# Patient Record
Sex: Female | Born: 1975 | Race: White | Hispanic: Yes | Marital: Married | Smoking: Never smoker
Health system: Southern US, Community
[De-identification: ages and names within clinical notes are randomized; demographics above are authoritative.]

## PROBLEM LIST (undated history)

## (undated) DIAGNOSIS — K8020 Calculus of gallbladder without cholecystitis without obstruction: Secondary | ICD-10-CM

## (undated) DIAGNOSIS — Z789 Other specified health status: Secondary | ICD-10-CM

## (undated) HISTORY — DX: Calculus of gallbladder without cholecystitis without obstruction: K80.20

---

## 2003-01-21 ENCOUNTER — Emergency Department (HOSPITAL_COMMUNITY): Admission: EM | Admit: 2003-01-21 | Discharge: 2003-01-21 | Payer: Self-pay | Admitting: Emergency Medicine

## 2003-07-30 ENCOUNTER — Ambulatory Visit (HOSPITAL_COMMUNITY): Admission: RE | Admit: 2003-07-30 | Discharge: 2003-07-30 | Payer: Self-pay | Admitting: *Deleted

## 2003-08-13 ENCOUNTER — Ambulatory Visit (HOSPITAL_COMMUNITY): Admission: RE | Admit: 2003-08-13 | Discharge: 2003-08-13 | Payer: Self-pay | Admitting: *Deleted

## 2004-01-03 ENCOUNTER — Inpatient Hospital Stay (HOSPITAL_COMMUNITY): Admission: AD | Admit: 2004-01-03 | Discharge: 2004-01-03 | Payer: Self-pay | Admitting: Obstetrics and Gynecology

## 2004-01-04 ENCOUNTER — Inpatient Hospital Stay (HOSPITAL_COMMUNITY): Admission: RE | Admit: 2004-01-04 | Discharge: 2004-01-07 | Payer: Self-pay | Admitting: Obstetrics & Gynecology

## 2004-01-04 ENCOUNTER — Inpatient Hospital Stay (HOSPITAL_COMMUNITY): Admission: AD | Admit: 2004-01-04 | Discharge: 2004-01-04 | Payer: Self-pay | Admitting: *Deleted

## 2009-08-12 ENCOUNTER — Ambulatory Visit: Payer: Self-pay | Admitting: Obstetrics and Gynecology

## 2009-08-14 ENCOUNTER — Ambulatory Visit (HOSPITAL_COMMUNITY): Admission: RE | Admit: 2009-08-14 | Discharge: 2009-08-14 | Payer: Self-pay | Admitting: Family Medicine

## 2010-07-09 NOTE — Op Note (Signed)
Katherine Fernandez, Katherine Fernandez          ACCOUNT NO.:  [id number]   MEDICAL RECORD NO.:  [id number]          PATIENT TYPE:  INP   LOCATION:  LDR4                          FACILITY:  [facility]   PHYSICIAN:  Tilda Burrow, M.D. DATE OF BIRTH:  12-23-1975   DATE OF PROCEDURE:  01/05/2004  DATE OF DISCHARGE:                                  PROCEDURE NOTE   Date of delivery is going to be January 05, 2004 at 1425. Onset of labor is  January 04, 2004 at 2300. Length of first stage labor 14 hours and 45  minutes. Length of second stage labor 40 minutes. Length of third stage  labor 17 minutes.   DELIVERY NOTE:  Katherine Fernandez had a normal spontaneous vaginal delivery of a viable  female infant with Apgars of 9 and 9. Upon delivery of infant, it was  thoroughly suctioned, cord clamped, dried, and passed off to the nursery  nurse. Had good tone, good strong cry, and pinked up well. Apgars were 9 and  9. Third stage of labor was actively managed with 20 units of Pitocin in  1,000 cc of LR to rapid rate. Upon inspection, second degree perineal  laceration was noted which was infiltrated with 25 cc of 1% lidocaine for  local anesthetic and repaired with 4 interrupted sutures of 2-0 Vicryl and  the skin closed with subcuticular stitch with 2-0 Vicryl. Placenta was  delivered spontaneously via Schultze's mechanism. Three-vessel cord was  noted upon inspection. Good hemostasis was obtained. Estimated blood loss  was approximately 350 cc. Infant and mother were stabilized and transferred  out to the post partum unit.     Darl   DL/MEDQ  D:  16/11/9602  T:  01/05/2004  Job:  [id number]

## 2010-07-09 NOTE — H&P (Signed)
NAMELUDDIE, Katherine Fernandez          ACCOUNT NO.:  [id number]   MEDICAL RECORD NO.:  [id number]          PATIENT TYPE:  INP   LOCATION:  LDR4                          FACILITY:  [facility]   PHYSICIAN:  Tilda Burrow, M.D. DATE OF BIRTH:  [birth year]   DATE OF ADMISSION:  01/04/2004  DATE OF DISCHARGE:  LH                                HISTORY & PHYSICAL   This is an obstetrical patient who has decided to transfer from [facility] here to have her baby.  She is [redacted] weeks gestation.  She presented  last night in early labor.  She is a gravida 1 para 0.  She is now 3-4 cm,  completely effaced, zero station.   PAST MEDICAL HISTORY:  Negative.   FAMILY HISTORY:  Positive for hypertension and thrombophlebitis in her  mother.   PAST SURGICAL HISTORY:  Negative.  GBS is positive.   HISTORY OF PRESENT ILLNESS:  Prenatal course was uneventful.  Blood type is  B positive.  Antibody screen is negative.  Syphilis serology is negative.  Hepatitis B surface antigen is negative.  Rubella is immune.  GC and  chlamydia were both negative.  Pap smear was normal.  AFP was within normal  range.  Group B strep is positive.  We will treat her with ampicillin 2 g, 1  g q.4.   PLAN:  We are going to admit and start some pitocin augmentation and  continue our GBS prophylaxis.     Darl   DL/MEDQ  D:  16/11/9602  T:  01/05/2004  Job:  [id number]   cc:   [facility]

## 2012-01-10 LAB — OB RESULTS CONSOLE RPR: RPR: NONREACTIVE

## 2012-07-26 ENCOUNTER — Other Ambulatory Visit: Payer: Self-pay | Admitting: Obstetrics

## 2012-08-04 NOTE — H&P (Signed)
NAMEJOHNA, KEARL          ACCOUNT NO.:  [id number]  MEDICAL RECORD NO.:  [id number]  LOCATION:  PERIO                         FACILITY:  [facility]  PHYSICIAN:  Kathreen Cosier, M.D.DATE OF BIRTH:  Nov 21, 1975  DATE OF ADMISSION:  07/26/2012 DATE OF DISCHARGE:                             HISTORY & PHYSICAL   HISTORY OF PRESENT ILLNESS:  The patient is a 37 year old, gravida 4, para 2-1-0-3, who had a C-section in the past.  She is due on August 15, 2012 and desires repeat C-section and tubal ligation.  During this pregnancy, she has had a positive GBS.  Otherwise, she has had uneventful antenatal course.  PAST MEDICAL HISTORY:  Negative.  PAST SURGICAL HISTORY:  C-section x1.  SOCIAL HISTORY:  Negative.  SYSTEM REVIEW:  Noncontributory.  PHYSICAL EXAMINATION:  GENERAL:  Well-developed female in no distress. HEENT:  Negative. LUNGS:  Clear to P and A. HEART:  Regular rhythm.  No murmurs, no gallops. BREASTS:  Negative. ABDOMEN:  Term.  Pelvic, cervix closed. EXTREMITIES:  Negative.          ______________________________ Kathreen Cosier, M.D.     BAM/MEDQ  D:  08/03/2012  T:  08/04/2012  Job:  [id number]

## 2012-08-07 ENCOUNTER — Encounter (HOSPITAL_COMMUNITY)
Admission: RE | Admit: 2012-08-07 | Discharge: 2012-08-07 | Disposition: A | Discharge status: Home / Self Care | Payer: Medicaid Other | Source: Ambulatory Visit | Attending: Obstetrics | Admitting: Obstetrics

## 2012-08-07 ENCOUNTER — Encounter (HOSPITAL_COMMUNITY): Payer: Self-pay

## 2012-08-07 HISTORY — DX: Other specified health status: Z78.9

## 2012-08-07 LAB — CBC
HCT: 33.2 % — ABNORMAL LOW (ref 36.0–46.0)
Hemoglobin: 11.2 g/dL — ABNORMAL LOW (ref 12.0–15.0)
MCV: 83.2 fL (ref 78.0–100.0)
Platelets: 206 10*3/uL (ref 150–400)
RBC: 3.99 MIL/uL (ref 3.87–5.11)
WBC: 7.9 10*3/uL (ref 4.0–10.5)

## 2012-08-07 LAB — TYPE AND SCREEN: Antibody Screen: NEGATIVE

## 2012-08-07 LAB — ABO/RH: ABO/RH(D): B POS

## 2012-08-07 NOTE — Patient Instructions (Addendum)
20 [name]  08/07/2012   Your procedure is scheduled on:  08/09/12  Enter through the Main Entrance of [facility] at 6 AM.  Pick up the phone at the desk and dial [phone].   Call this number if you have problems the morning of surgery: [phone]   Remember:   Do not eat food:After Midnight.  Do not drink clear liquids: After Midnight.  Take these medicines the morning of surgery with A SIP OF WATER: NA   Do not wear jewelry, make-up or nail polish.  Do not wear lotions, powders, or perfumes. You may wear deodorant.  Do not shave 48 hours prior to surgery.  Do not bring valuables to the hospital.  [facility] is not responsible                  for any belongings or valuables brought to the hospital.  Contacts, dentures or bridgework may not be worn into surgery.  Leave suitcase in the car. After surgery it may be brought to your room.  For patients admitted to the hospital, checkout time is 11:00 AM the day of                discharge.   Patients discharged the day of surgery will not be allowed to drive                   home.  Name and phone number of your driver: NA  Special Instructions: Shower using CHG 2 nights before surgery and the night before surgery.  If you shower the day of surgery use CHG.  Use special wash - you have one bottle of CHG for all showers.  You should use approximately 1/3 of the bottle for each shower.   Please read over the following fact sheets that you were given: Surgical Site Infection Prevention

## 2012-08-09 ENCOUNTER — Encounter (HOSPITAL_COMMUNITY): Payer: Self-pay | Admitting: *Deleted

## 2012-08-09 ENCOUNTER — Encounter (HOSPITAL_COMMUNITY): Payer: Self-pay | Admitting: Anesthesiology

## 2012-08-09 ENCOUNTER — Encounter (HOSPITAL_COMMUNITY)
Admission: RE | Disposition: A | Discharge status: Home / Self Care | Payer: Self-pay | Source: Ambulatory Visit | Attending: Obstetrics

## 2012-08-09 ENCOUNTER — Inpatient Hospital Stay (HOSPITAL_COMMUNITY): Payer: Medicaid Other | Admitting: Anesthesiology

## 2012-08-09 ENCOUNTER — Inpatient Hospital Stay (HOSPITAL_COMMUNITY)
Admission: RE | Admit: 2012-08-09 | Discharge: 2012-08-11 | DRG: 766 | Disposition: A | Discharge status: Home / Self Care | Payer: Medicaid Other | Source: Ambulatory Visit | Attending: Obstetrics | Admitting: Obstetrics

## 2012-08-09 DIAGNOSIS — Z22330 Carrier of Group B streptococcus: Secondary | ICD-10-CM

## 2012-08-09 DIAGNOSIS — Z98891 History of uterine scar from previous surgery: Secondary | ICD-10-CM

## 2012-08-09 DIAGNOSIS — Z302 Encounter for sterilization: Secondary | ICD-10-CM

## 2012-08-09 DIAGNOSIS — O99892 Other specified diseases and conditions complicating childbirth: Secondary | ICD-10-CM | POA: Diagnosis present

## 2012-08-09 DIAGNOSIS — O34219 Maternal care for unspecified type scar from previous cesarean delivery: Principal | ICD-10-CM | POA: Diagnosis present

## 2012-08-09 DIAGNOSIS — O09529 Supervision of elderly multigravida, unspecified trimester: Secondary | ICD-10-CM | POA: Diagnosis present

## 2012-08-09 HISTORY — PX: TUBAL LIGATION: SHX77

## 2012-08-09 LAB — OB RESULTS CONSOLE ABO/RH: RH Type: POSITIVE

## 2012-08-09 SURGERY — Surgical Case
Anesthesia: Spinal

## 2012-08-09 MED ORDER — KETOROLAC TROMETHAMINE 30 MG/ML IJ SOLN: 30.0000 mg | Freq: Four times a day (QID) | INTRAMUSCULAR | Status: AC | PRN

## 2012-08-09 MED ORDER — ONDANSETRON HCL 4 MG PO TABS: 4.0000 mg | ORAL_TABLET | ORAL | Status: DC | PRN

## 2012-08-09 MED ORDER — LANOLIN HYDROUS EX OINT: 1.0000 "application " | TOPICAL_OINTMENT | CUTANEOUS | Status: DC | PRN

## 2012-08-09 MED ORDER — TETANUS-DIPHTH-ACELL PERTUSSIS 5-2.5-18.5 LF-MCG/0.5 IM SUSP: 0.5000 mL | Freq: Once | INTRAMUSCULAR | Status: DC

## 2012-08-09 MED ORDER — MEPERIDINE HCL 25 MG/ML IJ SOLN: 6.2500 mg | INTRAMUSCULAR | Status: DC | PRN

## 2012-08-09 MED ORDER — BUPIVACAINE HCL (PF) 0.25 % IJ SOLN
INTRAMUSCULAR | Status: AC
  Filled 2012-08-09: qty 30

## 2012-08-09 MED ORDER — MORPHINE SULFATE 0.5 MG/ML IJ SOLN
INTRAMUSCULAR | Status: AC
  Filled 2012-08-09: qty 10

## 2012-08-09 MED ORDER — DIPHENHYDRAMINE HCL 50 MG/ML IJ SOLN: 25.0000 mg | INTRAMUSCULAR | Status: DC | PRN

## 2012-08-09 MED ORDER — FENTANYL CITRATE 0.05 MG/ML IJ SOLN
INTRAMUSCULAR | Status: AC
  Filled 2012-08-09: qty 2

## 2012-08-09 MED ORDER — OXYCODONE-ACETAMINOPHEN 5-325 MG PO TABS
1.0000 | ORAL_TABLET | ORAL | Status: DC | PRN
  Administered 2012-08-11 (×2): 1 via ORAL
  Filled 2012-08-09 (×2): qty 1

## 2012-08-09 MED ORDER — SCOPOLAMINE 1 MG/3DAYS TD PT72: 1.0000 | MEDICATED_PATCH | Freq: Once | TRANSDERMAL | Status: DC

## 2012-08-09 MED ORDER — ONDANSETRON HCL 4 MG/2ML IJ SOLN
INTRAMUSCULAR | Status: DC | PRN
  Administered 2012-08-09: 4 mg via INTRAVENOUS

## 2012-08-09 MED ORDER — OXYTOCIN 10 UNIT/ML IJ SOLN
40.0000 [IU] | INTRAVENOUS | Status: DC | PRN
  Administered 2012-08-09: 40 [IU] via INTRAVENOUS

## 2012-08-09 MED ORDER — ZOLPIDEM TARTRATE 5 MG PO TABS: 5.0000 mg | ORAL_TABLET | Freq: Every evening | ORAL | Status: DC | PRN

## 2012-08-09 MED ORDER — OXYTOCIN 40 UNITS IN LACTATED RINGERS INFUSION - SIMPLE MED: 62.5000 mL/h | INTRAVENOUS | Status: AC

## 2012-08-09 MED ORDER — FENTANYL CITRATE 0.05 MG/ML IJ SOLN
INTRAMUSCULAR | Status: DC | PRN
  Administered 2012-08-09: 25 ug via INTRATHECAL

## 2012-08-09 MED ORDER — LACTATED RINGERS IV SOLN
INTRAVENOUS | Status: DC
  Administered 2012-08-09: 18:00:00 via INTRAVENOUS

## 2012-08-09 MED ORDER — FENTANYL CITRATE 0.05 MG/ML IJ SOLN: 25.0000 ug | INTRAMUSCULAR | Status: DC | PRN

## 2012-08-09 MED ORDER — LACTATED RINGERS IV SOLN
INTRAVENOUS | Status: DC | PRN
  Administered 2012-08-09: 08:00:00 via INTRAVENOUS

## 2012-08-09 MED ORDER — 0.9 % SODIUM CHLORIDE (POUR BTL) OPTIME
TOPICAL | Status: DC | PRN
  Administered 2012-08-09: 1000 mL

## 2012-08-09 MED ORDER — DIPHENHYDRAMINE HCL 25 MG PO CAPS: 25.0000 mg | ORAL_CAPSULE | Freq: Four times a day (QID) | ORAL | Status: DC | PRN

## 2012-08-09 MED ORDER — SIMETHICONE 80 MG PO CHEW: 80.0000 mg | CHEWABLE_TABLET | ORAL | Status: DC | PRN

## 2012-08-09 MED ORDER — ONDANSETRON HCL 4 MG/2ML IJ SOLN: 4.0000 mg | Freq: Three times a day (TID) | INTRAMUSCULAR | Status: DC | PRN

## 2012-08-09 MED ORDER — NALBUPHINE SYRINGE 5 MG/0.5 ML
5.0000 mg | INJECTION | INTRAMUSCULAR | Status: DC | PRN
  Filled 2012-08-09: qty 1

## 2012-08-09 MED ORDER — DIBUCAINE 1 % RE OINT: 1.0000 "application " | TOPICAL_OINTMENT | RECTAL | Status: DC | PRN

## 2012-08-09 MED ORDER — DIPHENHYDRAMINE HCL 25 MG PO CAPS: 25.0000 mg | ORAL_CAPSULE | ORAL | Status: DC | PRN

## 2012-08-09 MED ORDER — LACTATED RINGERS IV SOLN
INTRAVENOUS | Status: DC | PRN
  Administered 2012-08-09 (×4): via INTRAVENOUS

## 2012-08-09 MED ORDER — PRENATAL MULTIVITAMIN CH
1.0000 | ORAL_TABLET | Freq: Every day | ORAL | Status: DC
  Administered 2012-08-10 – 2012-08-11 (×2): 1 via ORAL
  Filled 2012-08-09 (×2): qty 1

## 2012-08-09 MED ORDER — SCOPOLAMINE 1 MG/3DAYS TD PT72
MEDICATED_PATCH | TRANSDERMAL | Status: AC
  Administered 2012-08-09: 1.5 mg via TRANSDERMAL
  Filled 2012-08-09: qty 1

## 2012-08-09 MED ORDER — SIMETHICONE 80 MG PO CHEW
80.0000 mg | CHEWABLE_TABLET | Freq: Three times a day (TID) | ORAL | Status: DC
  Administered 2012-08-10 – 2012-08-11 (×4): 80 mg via ORAL

## 2012-08-09 MED ORDER — WITCH HAZEL-GLYCERIN EX PADS: 1.0000 "application " | MEDICATED_PAD | CUTANEOUS | Status: DC | PRN

## 2012-08-09 MED ORDER — SENNOSIDES-DOCUSATE SODIUM 8.6-50 MG PO TABS
2.0000 | ORAL_TABLET | Freq: Every day | ORAL | Status: DC
  Administered 2012-08-10: 2 via ORAL

## 2012-08-09 MED ORDER — PHENYLEPHRINE HCL 10 MG/ML IJ SOLN
INTRAMUSCULAR | Status: DC | PRN
  Administered 2012-08-09 (×3): 80 ug via INTRAVENOUS
  Administered 2012-08-09 (×2): 40 ug via INTRAVENOUS
  Administered 2012-08-09: 80 ug via INTRAVENOUS

## 2012-08-09 MED ORDER — IBUPROFEN 600 MG PO TABS
600.0000 mg | ORAL_TABLET | Freq: Four times a day (QID) | ORAL | Status: DC
  Administered 2012-08-10 – 2012-08-11 (×6): 600 mg via ORAL
  Filled 2012-08-09 (×6): qty 1

## 2012-08-09 MED ORDER — MORPHINE SULFATE (PF) 0.5 MG/ML IJ SOLN
INTRAMUSCULAR | Status: DC | PRN
  Administered 2012-08-09: .15 mg via INTRATHECAL

## 2012-08-09 MED ORDER — NALBUPHINE SYRINGE 5 MG/0.5 ML
INJECTION | INTRAMUSCULAR | Status: AC
  Administered 2012-08-09: 10 mg via SUBCUTANEOUS
  Filled 2012-08-09: qty 1

## 2012-08-09 MED ORDER — SODIUM CHLORIDE 0.9 % IJ SOLN: 3.0000 mL | INTRAMUSCULAR | Status: DC | PRN

## 2012-08-09 MED ORDER — NALOXONE HCL 1 MG/ML IJ SOLN
1.0000 ug/kg/h | INTRAVENOUS | Status: DC | PRN
  Filled 2012-08-09: qty 2

## 2012-08-09 MED ORDER — KETOROLAC TROMETHAMINE 60 MG/2ML IM SOLN
INTRAMUSCULAR | Status: AC
  Administered 2012-08-09: 60 mg via INTRAMUSCULAR
  Filled 2012-08-09: qty 2

## 2012-08-09 MED ORDER — DIPHENHYDRAMINE HCL 50 MG/ML IJ SOLN: 12.5000 mg | INTRAMUSCULAR | Status: DC | PRN

## 2012-08-09 MED ORDER — KETOROLAC TROMETHAMINE 60 MG/2ML IM SOLN: 60.0000 mg | Freq: Once | INTRAMUSCULAR | Status: AC | PRN

## 2012-08-09 MED ORDER — LACTATED RINGERS IV SOLN
INTRAVENOUS | Status: DC
  Administered 2012-08-09: 07:00:00 via INTRAVENOUS

## 2012-08-09 MED ORDER — PHENYLEPHRINE 40 MCG/ML (10ML) SYRINGE FOR IV PUSH (FOR BLOOD PRESSURE SUPPORT)
PREFILLED_SYRINGE | INTRAVENOUS | Status: AC
  Filled 2012-08-09: qty 10

## 2012-08-09 MED ORDER — ONDANSETRON HCL 4 MG/2ML IJ SOLN: 4.0000 mg | INTRAMUSCULAR | Status: DC | PRN

## 2012-08-09 MED ORDER — METOCLOPRAMIDE HCL 5 MG/ML IJ SOLN: 10.0000 mg | Freq: Three times a day (TID) | INTRAMUSCULAR | Status: DC | PRN

## 2012-08-09 MED ORDER — BUPIVACAINE IN DEXTROSE 0.75-8.25 % IT SOLN
INTRATHECAL | Status: DC | PRN
  Administered 2012-08-09: 1.4 mL via INTRATHECAL

## 2012-08-09 MED ORDER — MENTHOL 3 MG MT LOZG: 1.0000 | LOZENGE | OROMUCOSAL | Status: DC | PRN

## 2012-08-09 MED ORDER — CEFAZOLIN SODIUM-DEXTROSE 2-3 GM-% IV SOLR
INTRAVENOUS | Status: AC
  Administered 2012-08-09: 2 g via INTRAVENOUS
  Filled 2012-08-09: qty 50

## 2012-08-09 MED ORDER — NALOXONE HCL 0.4 MG/ML IJ SOLN: 0.4000 mg | INTRAMUSCULAR | Status: DC | PRN

## 2012-08-09 SURGICAL SUPPLY — 32 items
ADH SKN CLS APL DERMABOND .7 (GAUZE/BANDAGES/DRESSINGS) ×2
CLAMP CORD UMBIL (MISCELLANEOUS) IMPLANT
CLOTH BEACON ORANGE TIMEOUT ST (SAFETY) ×3 IMPLANT
DERMABOND ADVANCED (GAUZE/BANDAGES/DRESSINGS) ×1
DERMABOND ADVANCED .7 DNX12 (GAUZE/BANDAGES/DRESSINGS) ×2 IMPLANT
DRAPE LG THREE QUARTER DISP (DRAPES) ×3 IMPLANT
DRSG OPSITE POSTOP 4X10 (GAUZE/BANDAGES/DRESSINGS) ×3 IMPLANT
DURAPREP 26ML APPLICATOR (WOUND CARE) ×3 IMPLANT
ELECT REM PT RETURN 9FT ADLT (ELECTROSURGICAL) ×3
ELECTRODE REM PT RTRN 9FT ADLT (ELECTROSURGICAL) ×2 IMPLANT
EXTRACTOR VACUUM M CUP 4 TUBE (SUCTIONS) IMPLANT
GLOVE BIO SURGEON STRL SZ8.5 (GLOVE) ×3 IMPLANT
GOWN PREVENTION PLUS XXLARGE (GOWN DISPOSABLE) ×3 IMPLANT
GOWN STRL REIN XL XLG (GOWN DISPOSABLE) ×6 IMPLANT
KIT ABG SYR 3ML LUER SLIP (SYRINGE) IMPLANT
NDL HYPO 25X5/8 SAFETYGLIDE (NEEDLE) ×2 IMPLANT
NEEDLE HYPO 25X5/8 SAFETYGLIDE (NEEDLE) ×3 IMPLANT
NS IRRIG 1000ML POUR BTL (IV SOLUTION) ×3 IMPLANT
PACK C SECTION WH (CUSTOM PROCEDURE TRAY) ×3 IMPLANT
PAD OB MATERNITY 4.3X12.25 (PERSONAL CARE ITEMS) ×3 IMPLANT
SUT CHROMIC 0 CT 802H (SUTURE) ×3 IMPLANT
SUT CHROMIC 1 CTX 36 (SUTURE) ×6 IMPLANT
SUT CHROMIC 2 0 SH (SUTURE) ×3 IMPLANT
SUT GUT PLAIN 0 CT-3 TAN 27 (SUTURE) IMPLANT
SUT MON AB 4-0 PS1 27 (SUTURE) ×3 IMPLANT
SUT VIC AB 0 CT1 18XCR BRD8 (SUTURE) IMPLANT
SUT VIC AB 0 CT1 8-18 (SUTURE)
SUT VIC AB 0 CTX 36 (SUTURE) ×6
SUT VIC AB 0 CTX36XBRD ANBCTRL (SUTURE) ×4 IMPLANT
TOWEL OR 17X24 6PK STRL BLUE (TOWEL DISPOSABLE) ×9 IMPLANT
TRAY FOLEY CATH 14FR (SET/KITS/TRAYS/PACK) ×3 IMPLANT
WATER STERILE IRR 1000ML POUR (IV SOLUTION) ×3 IMPLANT

## 2012-08-09 NOTE — Anesthesia Postprocedure Evaluation (Signed)
  Anesthesia Post-op Note  Patient: Katherine Fernandez  Procedure(s) Performed: Procedure(s): REPEAT CESAREAN SECTION (N/A) BILATERAL TUBAL LIGATION (Bilateral)  Patient Location: Mother/Baby  Anesthesia Type:Spinal  Level of Consciousness: awake  Airway and Oxygen Therapy: Patient Spontanous Breathing  Post-op Pain: mild  Post-op Assessment: Patient's Cardiovascular Status Stable and Respiratory Function Stable  Post-op Vital Signs: stable  Complications: No apparent anesthesia complications

## 2012-08-09 NOTE — Anesthesia Procedure Notes (Signed)
Spinal  Patient location during procedure: OR Start time: 08/09/2012 7:39 AM Staffing Anesthesiologist: Jourdan Maldonado A. Performed by: anesthesiologist  Preanesthetic Checklist Completed: patient identified, site marked, surgical consent, pre-op evaluation, timeout performed, IV checked, risks and benefits discussed and monitors and equipment checked Spinal Block Patient position: sitting Prep: site prepped and draped and DuraPrep Patient monitoring: heart rate, cardiac monitor, continuous pulse ox and blood pressure Approach: midline Location: L3-4 Injection technique: single-shot Needle Needle type: Sprotte  Needle gauge: 24 G Needle length: 9 cm Assessment Sensory level: T4 Additional Notes Patient tolerated procedure well. Adequate sensory level.

## 2012-08-09 NOTE — Transfer of Care (Signed)
Immediate Anesthesia Transfer of Care Note  Patient: Katherine Fernandez  Procedure(s) Performed: Procedure(s): REPEAT CESAREAN SECTION (N/A)  Patient Location: PACU  Anesthesia Type:Spinal  Level of Consciousness: awake, alert  and oriented  Airway & Oxygen Therapy: Patient Spontanous Breathing  Post-op Assessment: Report given to PACU RN and Post -op Vital signs reviewed and stable  Post vital signs: Reviewed and stable  Complications: No apparent anesthesia complications

## 2012-08-09 NOTE — Lactation Note (Signed)
This note was copied from the chart of Katherine Fernandez. Lactation Consultation Note  Patient Name: Katherine Fernandez [id number] Date: 08/09/2012 Reason for consult: Initial assessment Mother in PACU and in to assist. Baby was latched and had been assisted by the Adm. RN. Mother is experienced with breastfeeding and she is able to self attach her baby. Assisted was needed due to mother's position in the bed. Mother doesn't speak [place] and interpreter present. Basic teaching done including the benefits of exclusive breastfeeding, omitting pacifiers, rooming in and feeding with cues. Mother asked if she "has enough milk now for her baby" and teaching done. Mother was shown hand expression and she has lots of colostrum. Lactation Handout given with explanation of services and support. Nursing and LC to assist mother with feedings as needed.  Maternal Data Formula Feeding for Exclusion: Yes Reason for exclusion: Mother's choice to formula and breast feed on admission Infant to breast within first hour of birth: Yes Has patient been taught Hand Expression?: Yes (demonstrated, will need additional teaching) Does the patient have breastfeeding experience prior to this delivery?: Yes  Feeding Feeding Type: Breast Milk Feeding method: Breast Length of feed: 15 min  LATCH Score/Interventions Latch: Grasps breast easily, tongue down, lips flanged, rhythmical sucking.  Audible Swallowing: Spontaneous and intermittent Intervention(s): Skin to skin  Type of Nipple: Everted at rest and after stimulation  Comfort (Breast/Nipple): Soft / non-tender     Hold (Positioning): Assistance needed to correctly position infant at breast and maintain latch. (assitance due to position in bed, PACU) Intervention(s): Breastfeeding basics reviewed;Skin to skin (teaching with a spanish interpreter)  LATCH Score: 9  Lactation Tools Discussed/Used     Consult Status      Omar Person 08/09/2012, 9:43 AM

## 2012-08-09 NOTE — Anesthesia Postprocedure Evaluation (Signed)
Anesthesia Post Note  Patient: Katherine Fernandez  Procedure(s) Performed: Procedure(s) (LRB): REPEAT CESAREAN SECTION (N/A) BILATERAL TUBAL LIGATION (Bilateral)  Anesthesia type: Spinal  Patient location: PACU  Post pain: Pain level controlled  Post assessment: Post-op Vital signs reviewed  Last Vitals:  Filed Vitals:   08/09/12 0617  BP: 115/75  Pulse: 98  Temp: 36.7 C  Resp: 18    Post vital signs: Reviewed  Level of consciousness: awake  Complications: No apparent anesthesia complications

## 2012-08-09 NOTE — Anesthesia Preprocedure Evaluation (Signed)
Anesthesia Evaluation  Patient identified by MRN, date of birth, ID band Patient awake    Reviewed: Allergy & Precautions, H&P , NPO status , Patient's Chart, lab work & pertinent test results, reviewed documented beta blocker date and time   History of Anesthesia Complications Negative for: history of anesthetic complications  Airway Mallampati: II TM Distance: >3 FB Neck ROM: full    Dental  (+) Teeth Intact   Pulmonary neg pulmonary ROS,  breath sounds clear to auscultation        Cardiovascular negative cardio ROS  Rhythm:regular Rate:Normal     Neuro/Psych negative neurological ROS  negative psych ROS   GI/Hepatic negative GI ROS, Neg liver ROS,   Endo/Other  negative endocrine ROS  Renal/GU negative Renal ROS  negative genitourinary   Musculoskeletal   Abdominal   Peds  Hematology negative hematology ROS (+)   Anesthesia Other Findings   Reproductive/Obstetrics (+) Pregnancy (h/o c/s x1, for repeat and BTL)                           Anesthesia Physical Anesthesia Plan  ASA: II  Anesthesia Plan: Spinal   Post-op Pain Management:    Induction:   Airway Management Planned:   Additional Equipment:   Intra-op Plan:   Post-operative Plan:   Informed Consent: I have reviewed the patients History and Physical, chart, labs and discussed the procedure including the risks, benefits and alternatives for the proposed anesthesia with the patient or authorized representative who has indicated his/her understanding and acceptance.     Plan Discussed with: Surgeon and CRNA  Anesthesia Plan Comments:         Anesthesia Quick Evaluation

## 2012-08-09 NOTE — Op Note (Signed)
preop diagnosis previous C-section at term multiparity postop diagnosis The same repeat low transverse cesarean section and tubal ligation Surgeon Dr. Francoise Ceo First assistant Dr. Coral Ceo Anesthesia spinal Procedure patient placed on the operating table in the supine position abdomen prepped and draped bladder emptied with a Foley catheter transverse suprapubic incision made through the old scar carried him to the rectus fascia fascia cleaned and incised the length of the incision recti muscles retracted laterally peritoneum incised longitudinally transverse incision made on the visceroperitoneum above the bladder bladder mobilized inferiorly transverse low uterine incision made the fluids clear patient delivered from the OP position of a female Apgar 8 and and a him in attendance placenta anterior removed manually and sent to labor and delivery uterine cavity clean with dry laps uterine incision closed in one layer with continuous  l one chromic hemostasis satisfactory the right tube  grasped in the midportion with a Babcock clamp 0 plain suture placed in the mesial salpinx below the portion of tube within the clamp was tied and and 1 inch of tube transected procedure done in a similar fashion on the other side lap and sponge counts correct abdomen closed in layers peritoneum continuous within of 0 chromic fascia continuous within of 0 Dexon and the skin closed with subcuticular stitch of 4-0 Monocryl blood loss was 600 cc patient tolerated the procedure well

## 2012-08-09 NOTE — H&P (Signed)
There has been no change in her history and physical since her original dictation  

## 2012-08-09 NOTE — Progress Notes (Signed)
UR completed 

## 2012-08-10 ENCOUNTER — Encounter (HOSPITAL_COMMUNITY): Payer: Self-pay | Admitting: Obstetrics

## 2012-08-10 LAB — CBC
Hemoglobin: 10.1 g/dL — ABNORMAL LOW (ref 12.0–15.0)
MCH: 27.7 pg (ref 26.0–34.0)
Platelets: 184 10*3/uL (ref 150–400)
RBC: 3.64 MIL/uL — ABNORMAL LOW (ref 3.87–5.11)
WBC: 9.8 10*3/uL (ref 4.0–10.5)

## 2012-08-10 NOTE — Progress Notes (Signed)
Patient ID: Katherine Fernandez, female   DOB: [birth year], 38 y.o.   MRN: [id number] Postop day 1 Vital signs normal Abdomen soft Legs negative doing well and

## 2012-08-11 MED ORDER — IBUPROFEN 600 MG PO TABS: 600.0000 mg | ORAL_TABLET | Freq: Four times a day (QID) | ORAL | Status: DC | PRN

## 2012-08-11 MED ORDER — OXYCODONE-ACETAMINOPHEN 5-325 MG PO TABS: 1.0000 | ORAL_TABLET | ORAL | Status: DC | PRN

## 2012-08-11 NOTE — Progress Notes (Signed)
Subjective: Postpartum Day 2: Cesarean Delivery Patient reports tolerating PO, + flatus and no problems voiding.    Objective: Vital signs in last 24 hours: Temp:  [98.4 F (36.9 C)-98.6 F (37 C)] 98.4 F (36.9 C) (06/21 0547) Pulse Rate:  [72-78] 78 (06/21 0547) Resp:  [18-20] 18 (06/21 0547) BP: (108-112)/(60-72) 112/60 mmHg (06/21 0547) SpO2:  [94 %-99 %] 94 % (06/21 0547)  Physical Exam:  General: alert and no distress Lochia: appropriate Uterine Fundus: firm Incision: healing well DVT Evaluation: No evidence of DVT seen on physical exam.   Recent Labs  08/10/12 0545  HGB 10.1*  HCT 30.0*    Assessment/Plan: Status post Cesarean section. Doing well postoperatively.  Discharge home with standard precautions and return to clinic in 4-6 weeks.  Ashar Lewinski A 08/11/2012, 7:46 AM

## 2012-08-11 NOTE — Discharge Summary (Signed)
Obstetric Discharge Summary Reason for Admission: cesarean section and BTL Prenatal Procedures: ultrasound Intrapartum Procedures: cesarean: low cervical, transverse and tubal ligation Postpartum Procedures: none Complications-Operative and Postpartum: none Hemoglobin  Date Value Range Status  08/10/2012 10.1* 12.0 - 15.0 g/dL Final     HCT  Date Value Range Status  08/10/2012 30.0* 36.0 - 46.0 % Final    Physical Exam:  General: alert and no distress Lochia: appropriate Uterine Fundus: firm Incision: healing well DVT Evaluation: No evidence of DVT seen on physical exam.  Discharge Diagnoses: Term Pregnancy-delivered  Discharge Information: Date: 08/11/2012 Activity: pelvic rest Diet: routine Medications: PNV, Ibuprofen, Colace and Percocet Condition: stable Instructions: refer to practice specific booklet Discharge to: home Follow-up Information   Follow up with MARSHALL,BERNARD A, MD. Schedule an appointment as soon as possible for a visit in 6 weeks.   Contact information:   [address] [place] [address] [phone]       Newborn Data: Live born female  Birth Weight: 6 lb 11.8 oz (3055 g) APGAR: 8, 9  Home with mother.  HARPER,CHARLES A 08/11/2012, 7:52 AM

## 2013-12-23 ENCOUNTER — Encounter (HOSPITAL_COMMUNITY): Payer: Self-pay | Admitting: Obstetrics

## 2021-04-03 ENCOUNTER — Emergency Department (HOSPITAL_COMMUNITY)
Admission: EM | Admit: 2021-04-03 | Discharge: 2021-04-03 | Disposition: A | Discharge status: Home / Self Care | Payer: Self-pay | Attending: Emergency Medicine | Admitting: Emergency Medicine

## 2021-04-03 ENCOUNTER — Other Ambulatory Visit: Payer: Self-pay

## 2021-04-03 ENCOUNTER — Emergency Department (HOSPITAL_COMMUNITY): Payer: Self-pay

## 2021-04-03 ENCOUNTER — Encounter (HOSPITAL_COMMUNITY): Payer: Self-pay | Admitting: Emergency Medicine

## 2021-04-03 DIAGNOSIS — K8020 Calculus of gallbladder without cholecystitis without obstruction: Secondary | ICD-10-CM | POA: Insufficient documentation

## 2021-04-03 DIAGNOSIS — R52 Pain, unspecified: Secondary | ICD-10-CM

## 2021-04-03 DIAGNOSIS — R1011 Right upper quadrant pain: Secondary | ICD-10-CM

## 2021-04-03 DIAGNOSIS — R202 Paresthesia of skin: Secondary | ICD-10-CM | POA: Insufficient documentation

## 2021-04-03 DIAGNOSIS — R0789 Other chest pain: Secondary | ICD-10-CM | POA: Insufficient documentation

## 2021-04-03 DIAGNOSIS — R002 Palpitations: Secondary | ICD-10-CM | POA: Insufficient documentation

## 2021-04-03 DIAGNOSIS — Z20822 Contact with and (suspected) exposure to covid-19: Secondary | ICD-10-CM | POA: Insufficient documentation

## 2021-04-03 DIAGNOSIS — M25512 Pain in left shoulder: Secondary | ICD-10-CM | POA: Insufficient documentation

## 2021-04-03 DIAGNOSIS — R0602 Shortness of breath: Secondary | ICD-10-CM | POA: Insufficient documentation

## 2021-04-03 LAB — COMPREHENSIVE METABOLIC PANEL
ALT: 19 U/L (ref 0–44)
AST: 19 U/L (ref 15–41)
Albumin: 4.1 g/dL (ref 3.5–5.0)
Alkaline Phosphatase: 72 U/L (ref 38–126)
Anion gap: 10 (ref 5–15)
BUN: 7 mg/dL (ref 6–20)
CO2: 26 mmol/L (ref 22–32)
Calcium: 9.4 mg/dL (ref 8.9–10.3)
Chloride: 104 mmol/L (ref 98–111)
Creatinine, Ser: 0.78 mg/dL (ref 0.44–1.00)
GFR, Estimated: >60 mL/min (ref >60)
Glucose, Bld: 92 mg/dL (ref 70–99)
Potassium: 3.4 mmol/L — ABNORMAL LOW (ref 3.5–5.1)
Sodium: 140 mmol/L (ref 135–145)
Total Bilirubin: 0.8 mg/dL (ref 0.3–1.2)
Total Protein: 6.9 g/dL (ref 6.5–8.1)

## 2021-04-03 LAB — RESP PANEL BY RT-PCR (FLU A&B, COVID) ARPGX2
Influenza A by PCR: NEGATIVE
Influenza B by PCR: NEGATIVE
SARS Coronavirus 2 by RT PCR: NEGATIVE

## 2021-04-03 LAB — URINALYSIS, ROUTINE W REFLEX MICROSCOPIC
Bilirubin Urine: NEGATIVE
Glucose, UA: NEGATIVE mg/dL
Ketones, ur: NEGATIVE mg/dL
Leukocytes,Ua: NEGATIVE
Nitrite: NEGATIVE
Protein, ur: NEGATIVE mg/dL
Specific Gravity, Urine: 1.015 (ref 1.005–1.030)
pH: 6 (ref 5.0–8.0)

## 2021-04-03 LAB — CBC
HCT: 40.9 % (ref 36.0–46.0)
Hemoglobin: 13.9 g/dL (ref 12.0–15.0)
MCH: 29.3 pg (ref 26.0–34.0)
MCHC: 34 g/dL (ref 30.0–36.0)
MCV: 86.1 fL (ref 80.0–100.0)
Platelets: 257 10*3/uL (ref 150–400)
RBC: 4.75 MIL/uL (ref 3.87–5.11)
RDW: 12.2 % (ref 11.5–15.5)
WBC: 7.3 10*3/uL (ref 4.0–10.5)
nRBC: 0 % (ref 0.0–0.2)

## 2021-04-03 LAB — LIPASE, BLOOD: Lipase: 35 U/L (ref 11–51)

## 2021-04-03 LAB — TROPONIN I (HIGH SENSITIVITY)
Troponin I (High Sensitivity): 3 ng/L (ref <18)
Troponin I (High Sensitivity): <2 ng/L (ref <18)

## 2021-04-03 LAB — I-STAT BETA HCG BLOOD, ED (MC, WL, AP ONLY): I-stat hCG, quantitative: <5 m[IU]/mL (ref <5)

## 2021-04-03 MED ORDER — KETOROLAC TROMETHAMINE 15 MG/ML IJ SOLN
15.0000 mg | Freq: Once | INTRAMUSCULAR | Status: AC
  Administered 2021-04-03: 15 mg via INTRAMUSCULAR
  Filled 2021-04-03: qty 1

## 2021-04-03 NOTE — Discharge Instructions (Addendum)
Le he dado un plan de comida para ayudar con sus dolores del estomago.  Todos los resultados estuvieron normales donde su visita. Tiene que establecer una consulta con un doctor primario, por favor llame al [employer] en sus papeles.

## 2021-04-03 NOTE — ED Provider Notes (Signed)
[facility] EMERGENCY DEPARTMENT Provider Note   CSN: [id number] Arrival date & time: 04/03/21  3790     History  Chief Complaint  Patient presents with   Abdominal Pain   Chest Pain    Katherine Fernandez is a 46 y.o. female.  46 y.o female with no PMH presents to the ED with a chief complaint of right upper quadrant pain with radiation to her left chest and arm.  Reports symptoms have been ongoing for the past week, also endorsing some shortness of breath.  Patient states feeling sort of palpitations like her heart is beating out of proportion to her left shoulder.  She does not have any prior cardiac history, no prior family history of CAD.  Non-smoker.  The history is provided by the patient and medical records.  Abdominal Pain Pain location:  RUQ Pain quality: aching   Pain radiates to:  Chest Pain severity:  Mild Onset quality:  Gradual Duration:  1 week Timing:  Constant Progression:  Worsening Chronicity:  New Context: not alcohol use, not awakening from sleep, not diet changes and not eating   Worsened by:  Nothing Ineffective treatments:  Acetaminophen Associated symptoms: chest pain, nausea and shortness of breath   Associated symptoms: no constipation, no cough, no fever, no hematemesis, no sore throat, no vaginal bleeding, no vaginal discharge and no vomiting   Risk factors: obesity   Risk factors: no alcohol abuse, no aspirin use, not elderly, has not had multiple surgeries, no NSAID use and not pregnant   Chest Pain Pain location:  L chest Pain quality: dull   Pain radiates to:  Does not radiate Pain severity:  Moderate Onset quality:  Sudden Timing:  Constant Relieved by:  Nothing Worsened by:  Nothing Associated symptoms: abdominal pain, nausea and shortness of breath   Associated symptoms: no back pain, no cough, no fever, no headache and no vomiting       Home Medications Prior to Admission medications   Medication Sig Start  Date End Date Taking? Authorizing Provider  ibuprofen (ADVIL,MOTRIN) 600 MG tablet Take 1 tablet (600 mg total) by mouth every 6 (six) hours as needed for pain. 08/11/12   Brock Bad, MD  oxyCODONE-acetaminophen (PERCOCET/ROXICET) 5-325 MG per tablet Take 1-2 tablets by mouth every 4 (four) hours as needed. 08/11/12   Brock Bad, MD      Allergies    Patient has no known allergies.    Review of Systems   Review of Systems  Constitutional:  Negative for fever.  HENT:  Negative for sinus pressure and sore throat.   Respiratory:  Positive for shortness of breath. Negative for cough.   Cardiovascular:  Positive for chest pain.  Gastrointestinal:  Positive for abdominal pain and nausea. Negative for constipation, hematemesis and vomiting.  Genitourinary:  Negative for flank pain, vaginal bleeding and vaginal discharge.  Musculoskeletal:  Negative for back pain.  Neurological:  Negative for light-headedness and headaches.  All other systems reviewed and are negative.  Physical Exam Updated Vital Signs BP 114/80    Pulse 71    Temp 98.7 F (37.1 C) (Oral)    Resp 18    Ht 5\' 5"  (1.651 m)    Wt 77.1 kg    SpO2 100%    BMI 28.29 kg/m  Physical Exam Vitals and nursing note reviewed.  Constitutional:      General: She is not in acute distress.    Appearance: She is well-developed.  HENT:  Head: Normocephalic and atraumatic.     Mouth/Throat:     Pharynx: No oropharyngeal exudate.  Eyes:     Pupils: Pupils are equal, round, and reactive to light.  Cardiovascular:     Rate and Rhythm: Regular rhythm.     Heart sounds: Normal heart sounds.  Pulmonary:     Effort: Pulmonary effort is normal. No respiratory distress.     Breath sounds: Normal breath sounds.  Chest:       Comments: Pain is reproducible with palpation to the left anterior aspect of the shoulder. Abdominal:     General: Bowel sounds are normal. There is no distension.     Palpations: Abdomen is soft.      Tenderness: There is abdominal tenderness in the right upper quadrant.  Musculoskeletal:        General: No tenderness or deformity.     Cervical back: Normal range of motion.     Right lower leg: No edema.     Left lower leg: No edema.  Skin:    General: Skin is warm and dry.  Neurological:     Mental Status: She is alert and oriented to person, place, and time.    ED Results / Procedures / Treatments   Labs (all labs ordered are listed, but only abnormal results are displayed) Labs Reviewed  COMPREHENSIVE METABOLIC PANEL - Abnormal; Notable for the following components:      Result Value   Potassium 3.4 (*)    All other components within normal limits  URINALYSIS, ROUTINE W REFLEX MICROSCOPIC - Abnormal; Notable for the following components:   APPearance HAZY (*)    Hgb urine dipstick SMALL (*)    Bacteria, UA RARE (*)    All other components within normal limits  RESP PANEL BY RT-PCR (FLU A&B, COVID) ARPGX2  LIPASE, BLOOD  CBC  I-STAT BETA HCG BLOOD, ED ([facility], [facility], [facility] ONLY)  TROPONIN I (HIGH SENSITIVITY)  TROPONIN I (HIGH SENSITIVITY)    EKG EKG Interpretation  Date/Time:  Saturday April 03 2021 04:21:00 EST Ventricular Rate:  93 PR Interval:  150 QRS Duration: 70 QT Interval:  336 QTC Calculation: 417 R Axis:   66 Text Interpretation: Normal sinus rhythm nonspecific T waves No old tracing to compare Confirmed by Pricilla LovelessGoldston, Scott [phone]([id number]) on 04/03/2021 9:15:49 AM  Radiology DG Chest 2 View  Result Date: 04/03/2021 CLINICAL DATA:  Chest pain EXAM: CHEST - 2 VIEW COMPARISON:  None. FINDINGS: Normal heart size and mediastinal contours. No acute infiltrate or edema. No effusion or pneumothorax. No acute osseous findings. IMPRESSION: Negative chest. Electronically Signed   By: Tiburcio PeaJonathan  Watts M.D.   On: 04/03/2021 05:25   US Abdomen Limited RUQ (LIVER/GB)  Result Date: 04/03/2021 CLINICAL DATA:  Pain EXAM: ULTRASOUND ABDOMEN LIMITED RIGHT UPPER QUADRANT COMPARISON:   None. FINDINGS: Gallbladder: Sludge and multiple tiny layering stones identified within the gallbladder. No pericholecystic fluid or sonographic Murphy's sign. No gallbladder wall thickening. Common bile duct: Diameter: 1.8 mm.  No intrahepatic bile duct dilatation Liver: Increased parenchymal echogenicity. No focal lesion identified. Portal vein is patent on color Doppler imaging with normal direction of blood flow towards the liver. Other: None. IMPRESSION: 1. No acute findings. 2. Gallbladder sludge and stones. Electronically Signed   By: Signa Kellaylor  Stroud M.D.   On: 04/03/2021 12:00    Procedures Procedures    Medications Ordered in ED Medications  ketorolac (TORADOL) 15 MG/ML injection 15 mg (15 mg Intramuscular Given 04/03/21 0909)  ED Course/ Medical Decision Making/ A&P                           Medical Decision Making Amount and/or Complexity of Data Reviewed Labs: ordered. Radiology: ordered.  Risk Prescription drug management.  This patient presents to the ED for concern of abdominal pain along with chest pain, this involves a number of treatment options, and is a complaint that carries with it a high risk of complications and morbidity.  The differential diagnosis includes ACS, pulmonary embolism, dissection, cholecystitis.   Co morbidities: Discussed in HPI   Brief History:  46 year old female with no cardiac risk factors presents to the ED with left-sided arm pain and chest pain along with some tingling.  Some right upper quadrant pain as well along with some nausea.  No prior medical history, no relief with over-the-counter Tylenol.  EMR reviewed including pt PMHx, past surgical history and past visits to ER.   See HPI for more details   Lab Tests:  I ordered and independently interpreted labs.  The pertinent results include:    I personally reviewed all laboratory work and imaging. Metabolic panel without any acute abnormality specifically kidney function  within normal limits and no significant electrolyte abnormalities. CBC without leukocytosis or significant anemia.  First troponin was negative we will obtain a repeat.  Beta hCG is also negative.  Her UA does show some small amounts of blood but some rare bacteria and not a clean specimen.   Imaging Studies:  NAD. I personally reviewed all imaging studies and no acute abnormality found. I agree with radiology interpretation.  Ultrasound of her gallbladder showed cholelithiasis however no signs of cholecystitis on today's visit.    Cardiac Monitoring:  The patient was maintained on a cardiac monitor.  I personally viewed and interpreted the cardiac monitored which showed an underlying rhythm of: EKG non-ischemic   Medicines ordered:  I ordered medication including Toradol to help with pain. Reevaluation of the patient after these medicines showed that the patient improved I have reviewed the patients home medicines and have made adjustments as needed  Reevaluation:  After the interventions noted above I re-evaluated patient and found that they have :improved   Social Determinants of Health:  The patient's social determinants of health were a factor in the care of this patient    Problem List / ED Course:  Abdominal pain along with chest pain.  She does not have any cardiac risk factors such as hypertension, hyper lipidemia, prior history of CAD, family history of CAD.  Non-smoker.  Not hypoxic, not tachycardic to suggest pulmonary embolism.  Does report some shortness of breath however chest x-ray is clear without any acute findings.   Dispostion:  After consideration of the diagnostic results and the patients response to treatment, I feel that the patent would benefit from establishing care with primary care physician.  She was provided with a copy of her ultrasound which showed cholelithiasis.  Discussed with her signs and symptoms of cholecystitis, she is to follow-up with  PCP after establishing care, given the [facility].   Portions of this note were generated with Scientist, clinical (histocompatibility and immunogenetics). Dictation errors may occur despite best attempts at proofreading.   Final Clinical Impression(s) / ED Diagnoses Final diagnoses:  Right upper quadrant abdominal pain  Chest wall pain  Pain    Rx / DC Orders ED Discharge Orders     None  Tyronica, [place], PA-C 04/03/21 1246    Pricilla Loveless, MD 04/03/21 1558

## 2021-04-03 NOTE — ED Triage Notes (Signed)
Pt reported to ED with c/o rt sided abdominal pain that radiates upwards and across to left chest and arm. States that pain has been ongoing x1 week. Also endorsees shob, tht increases with inspiration and "not being able to take breathe". Pt presents in no acute distress at time of triage.

## 2021-04-28 ENCOUNTER — Ambulatory Visit: Payer: Self-pay | Attending: Physician Assistant | Admitting: Physician Assistant

## 2021-04-28 ENCOUNTER — Encounter: Payer: Self-pay | Admitting: Physician Assistant

## 2021-04-28 ENCOUNTER — Other Ambulatory Visit: Payer: Self-pay

## 2021-04-28 VITALS — BP 137/90 | HR 78 | Wt 166.0 lb

## 2021-04-28 DIAGNOSIS — Z09 Encounter for follow-up examination after completed treatment for conditions other than malignant neoplasm: Secondary | ICD-10-CM

## 2021-04-28 DIAGNOSIS — K8020 Calculus of gallbladder without cholecystitis without obstruction: Secondary | ICD-10-CM

## 2021-04-28 DIAGNOSIS — E876 Hypokalemia: Secondary | ICD-10-CM

## 2021-04-28 DIAGNOSIS — Z789 Other specified health status: Secondary | ICD-10-CM

## 2021-04-28 NOTE — Progress Notes (Signed)
Patient ID: Katherine Fernandez, female   DOB: 1975/04/06, 46 y.o.   MRN: [id number] ? ? ? ?Katherine Fernandez, is a 46 y.o. female ? ?[id number] ? ?[id number] ? ?DOB - [birth year] ? ?Chief Complaint  ?Patient presents with  ? Hospitalization Follow-up  ?    ? ?Subjective:  ? ?Katherine Fernandez is a 46 y.o. female here today for a follow up visit and to establish care After ED visit 04/03/2021 for RUQ pain.  U/S showed gallbladder sludge and cholelithiasis.  She continues to have RUQ pain, usu after a meal but sometimes randomly.  No fever.  Appetite is good.  She has not had vomiting, diarrhea or constipation.  ? ? ?I personally reviewed all laboratory work and imaging. Metabolic panel without any acute abnormality specifically kidney function within normal limits and no significant electrolyte abnormalities. CBC without leukocytosis or significant anemia.  First troponin was negative we will obtain a repeat.  Beta hCG is also negative.  Her UA does show some small amounts of blood but some rare bacteria and not a clean specimen ? ?Abdominal pain along with chest pain.  She does not have any cardiac risk factors such as hypertension, hyper lipidemia, prior history of CAD, family history of CAD.  Non-smoker.  Not hypoxic, not tachycardic to suggest pulmonary embolism.  Does report some shortness of breath however chest x-ray is clear without any acute findings. ? ? Patient has No headache, No chest pain, No abdominal pain - No Nausea, No new weakness tingling or numbness, No Cough - SOB. ? ?No problems updated. ? ?ALLERGIES: ?No Known Allergies ? ?PAST MEDICAL HISTORY: ?Past Medical History:  ?Diagnosis Date  ? Medical history non-contributory   ? ? ?MEDICATIONS AT HOME: ?Prior to Admission medications   ?Medication Sig Start Date End Date Taking? Authorizing Provider  ?ibuprofen (ADVIL,MOTRIN) 600 MG tablet Take 1 tablet (600 mg total) by mouth every 6 (six) hours as needed for pain. 08/11/12  Yes Shelly Bombard,  MD  ?oxyCODONE-acetaminophen (PERCOCET/ROXICET) 5-325 MG per tablet Take 1-2 tablets by mouth every 4 (four) hours as needed. ?Patient not taking: Reported on 04/28/2021 08/11/12   Shelly Bombard, MD  ? ? ?ROS: ?Neg HEENT ?Neg resp ?Neg cardiac ?Neg GI ?Neg GU ?Neg MS ?Neg psych ?Neg neuro ? ?Objective:  ? ?Vitals:  ? 04/28/21 1540  ?BP: 137/90  ?Pulse: 78  ?SpO2: 98%  ?Weight: 166 lb (75.3 kg)  ? ?Exam ?General appearance : Awake, alert, not in any distress. Speech Clear. Not toxic looking ?HEENT: Atraumatic and Normocephalic ?Neck: Supple, no JVD. No cervical lymphadenopathy.  ?Chest: Good air entry bilaterally, CTAB.  No rales/rhonchi/wheezing ?CVS: S1 S2 regular, no murmurs.  ?Abdomen: Bowel sounds present, Non tender and not distended with no gaurding, rigidity or rebound except mild TTP RUQ but neg Murphy's  ?Extremities: B/L Lower Ext shows no edema, both legs are warm to touch ?Neurology: Awake alert, and oriented X 3, CN II-XII intact, Non focal ?Skin: No Rash ? ?Data Review ?No results found for: HGBA1C ? ?Assessment & Plan  ? ?1. Calculus of gallbladder without cholecystitis without obstruction ?Discussed s/sx of cholecystitis.  Eat low fat/low cholesterol diet.  Drink 80-100 ounces water daily.  To ED if severe abdominal pain/fever/vomiting ?- Comprehensive metabolic panel ?- Ambulatory referral to General Surgery ? ?2. Hypokalemia ?- Comprehensive metabolic panel ? ?3. Encounter for examination following treatment at hospital ? ?4. Language barrier ?[place] interpreter provider by translation services with [facility] ? ? ? ?Patient have  been counseled extensively about nutrition and exercise. Other issues discussed during this visit include: low cholesterol diet, weight control and daily exercise, foot care, annual eye examinations at Ophthalmology, importance of adherence with medications and regular follow-up. We also discussed long term complications of uncontrolled diabetes and hypertension.  ? ?Return in  about 3 months (around 07/29/2021) for assign PCP. ? ?The patient was given clear instructions to go to ER or return to medical center if symptoms don't improve, worsen or new problems develop. The patient verbalized understanding. The patient was told to call to get lab results if they haven't heard anything in the next week.  ? ? ? ? ?Freeman Caldron, PA-C ?[address] ?[place], [place] ?[phone]   ?04/28/2021, 4:00 PM  ?

## 2021-04-28 NOTE — Patient Instructions (Addendum)
Drink 80-100 ounces water daily  ? ?Colelitiasis ?Cholelithiasis ?La colelitiasis ocurre cuando se forman c?lculos biliares en la ves?cula biliar. La ves?cula biliar almacena bilis. La bilis es un l?quido que ayuda a Location manager las grasas. La bilis puede endurecerse y transformarse en c?lculos biliares. Si estos causan una obstrucci?n, Magazine features editor (ataque de ves?cula biliar). ??Cu?les son las causas? ?Esta afecci?n puede ser causada por lo siguiente: ?Algunas enfermedades de la sangre, como la anemia drepanoc?tica. ?Demasiada cantidad de una sustancia parecida a la grasa (colesterol) en la bilis. ?No tener suficiente cantidad de sales biliares en la bilis. Estas sales le ayudan al organismo a Environmental health practitioner y a Mining engineer. ?La ves?cula biliar no se vac?a completamente o con suficiente frecuencia. Esto es frecuente en las mujeres embarazadas. ??Qu? incrementa el riesgo? ?Los siguientes factores pueden hacer que sea m?s propenso a Clinical cytogeneticist afecci?n: ?Ser mujer. ?Estar embarazada muchas veces. ?Comer muchos alimentos fritos, grasas y carbohidratos refinados. ?Tener mucho sobrepeso (obesidad). ?Ser mayor de 40 a?os de edad. ?Usar medicamentos con hormonas femeninas durante [employer]. ?Adelgazar r?pidamente. ?Tener c?lculos biliares en la familia. ?Tener algunos problemas de [place], como diabetes, enfermedad de [place] o enfermedad hep?tica. ??Cu?les son los signos o s?ntomas? ?A menudo, puede haber c?lculos biliares, pero sin s?ntomas. Estos c?lculos biliares se denominan c?lculos silenciosos. Si un c?lculo biliar provoca una obstrucci?n, puede sentir dolor repentino. El dolor: ?Puede estar en la parte superior derecha del vientre (abdomen). ?Normalmente aparece a la noche o despu?s de comer. ?Puede durar una hora o m?s. ?Se puede extender hacia el hombro derecho, la espalda o el pecho. ?Puede sentirse como molestias, ardor o sensaci?n de plenitud en la parte superior del vientre (indigesti?n). ?Si la  obstrucci?n dura m?s de algunas horas, puede tener una infecci?n o hinchaz?n. Usted puede: ?Sentir que va a vomitar. ?Vomitar. ?Sentirse hinchado. ?Tener dolor en el vientre durante 5 horas o m?s. ?Sentir dolor con la palpaci?n en el vientre, a menudo en la parte superior derecha y debajo de las [place]. ?Tener fiebre o escalofr?os. ?Notar que la piel o la zona blanca de los ojos se vuelven amarillas (ictericia). ?Tener el pis (orina) oscuro o las deposiciones (heces) p?lidas. ??C?mo se trata? ?El tratamiento de esta afecci?n depende de qu? tan mal se sienta. Si tiene s?ntomas, puede necesitar lo siguiente: ?Medical laboratory scientific officer, si los s?ntomas no son Lynnae Sandhoff graves. ?No coma durante 12 a 24 horas. Beba solamente agua y l?quidos claros. ?Comience a comer alimentos simples o claros despu?s de 1 o 2 d?as. Pruebe con caldos y galletas. ?Es posible que necesite medicamentos para Chief Technology Officer o Counsellor, o ambos. ?Si tiene una infecci?n, necesitar? antibi?ticos. ?Hospitalizaci?n, si tiene un dolor muy intenso o una infecci?n muy grave. ?Cirug?a para extirpar la ves?cula biliar. Puede ser necesario si: ?Los c?lculos biliares siguen apareciendo. ?Tiene s?ntomas muy graves. ?Medicamentos para destruir los c?lculos biliares. Medicamentos: ?Son mejores para los c?lculos peque?os. ?Pueden usarse durante hasta 6 a 12 meses. ?Un procedimiento para encontrar y extraer los c?lculos biliares o para fragmentarlos. ?Siga estas instrucciones en su casa: ?Medicamentos ?Use los medicamentos de venta libre y los recetados solamente como se lo haya indicado el m?dico. ?Si le recetaron un antibi?tico, t?melo como se lo haya indicado el m?dico. No deje de tomar el antibi?tico aunque comience a sentirse mejor. ?Preg?ntele al m?dico si el medicamento recetado le impide conducir o usar [place]. ?Comida y bebida ?Beba suficiente l?quido como para mantener la orina de color amarillo p?lido.  Beba agua o l?quidos claros. Esto es  importante cuando Electronics engineer. ?Consuma alimentos saludables. Elija: ?Menos alimentos grasos, como las comidas fritas. ?Menos carbohidratos refinados. Evite los panes y los cereales muy procesados, como el pan blanco y el arroz blanco. Elija cereales integrales, como el pan integral y [place] integral. ?M?s fibra. [place], las frutas frescas y los frijoles son fuentes saludables. ?Instrucciones generales ?Mantenga un peso saludable. ?Concurra a todas las visitas de seguimiento como se lo haya indicado el m?dico. Esto es importante. ?D?nde buscar m?s informaci?n ?[employer] of Diabetes and Digestive and Kidney Diseases [employer] de la Diabetes y las Enfermedades Digestivas y [place]): [email or web address] ?Comun?quese con un m?dico si: ?Siente dolor repentino en el costado superior derecho del vientre. El dolor podr?a extenderse hasta el hombro derecho, la espalda o el pecho. ?Le han diagnosticado c?lculos biliares que no presentan s?ntomas y tiene lo siguiente: ?Dolor abdominal. ?Molestias, ardor o sensaci?n de plenitud en la parte superior del abdomen. ?La orina es de color oscuro o tiene heces p?lidas. ?Solicite ayuda de inmediato si: ?Tiene dolor repentino en la parte superior derecha del abdomen y el dolor dura m?s de 2 horas. ?Tiene dolor en el abdomen y: ?Dura m?s de 5 horas. ?Sigue empeorando. ?Tiene fiebre o escalofr?os. ?Tiene ganas continuas de vomitar. ?Sigue vomitando. ?La piel y la parte blanca de los ojos se ponen amarillos. ?Resumen ?La colelitiasis ocurre cuando se forman c?lculos biliares en la ves?cula biliar. ?La causa de esta afecci?n puede ser Neomia Dear enfermedad de la [place], una cantidad excesiva de una sustancia parecida a la grasa en la bilis o una cantidad insuficiente de sales biliares. ?El tratamiento de esta afecci?n depende de qu? tan mal se sienta. ?Si tiene s?ntomas, no coma ni beba. Es posible que necesite tomar medicamentos. Tal vez necesite hospitalizaci?n si  tiene un dolor muy intenso o una infecci?n muy grave. ?Es posible que deba someterse a una cirug?a si los c?lculos siguen apareciendo o si tiene s?ntomas muy graves. ?Esta informaci?n no tiene Theme park manager el consejo del m?dico. Aseg?rese de hacerle al m?dico cualquier pregunta que tenga. ?Document Revised: 03/15/2019 Document Reviewed: 03/15/2019 ?Elsevier Patient Education ? 2022 Elsevier Inc. ? ?

## 2021-04-29 LAB — COMPREHENSIVE METABOLIC PANEL
ALT: 16 IU/L (ref 0–32)
AST: 18 IU/L (ref 0–40)
Albumin/Globulin Ratio: 1.8 (ref 1.2–2.2)
Albumin: 4.8 g/dL (ref 3.8–4.8)
Alkaline Phosphatase: 90 IU/L (ref 44–121)
BUN/Creatinine Ratio: 16 (ref 9–23)
BUN: 13 mg/dL (ref 6–24)
Bilirubin Total: 0.3 mg/dL (ref 0.0–1.2)
CO2: 23 mmol/L (ref 20–29)
Calcium: 10.3 mg/dL — ABNORMAL HIGH (ref 8.7–10.2)
Chloride: 99 mmol/L (ref 96–106)
Creatinine, Ser: 0.83 mg/dL (ref 0.57–1.00)
Globulin, Total: 2.6 g/dL (ref 1.5–4.5)
Glucose: 85 mg/dL (ref 70–99)
Potassium: 4.3 mmol/L (ref 3.5–5.2)
Sodium: 140 mmol/L (ref 134–144)
Total Protein: 7.4 g/dL (ref 6.0–8.5)
eGFR: 89 mL/min/{1.73_m2} (ref >59)

## 2021-05-18 ENCOUNTER — Telehealth: Payer: Self-pay

## 2021-05-18 NOTE — Telephone Encounter (Signed)
Copied from [place] [id number]. Topic: Referral - Request for Referral >> May 17, 2021  4:18 PM Oneta Rack wrote: Osvaldo Human would like general surgery referral placed at a [facility] location rather then a [place] location. Caller states [place] is to far. Caller states please call him not patient because she does not understand English and would need a spanish interpreter so please call Verlee Rossetti (Spouse) [phone].

## 2021-07-30 ENCOUNTER — Encounter: Payer: Self-pay | Admitting: Nurse Practitioner

## 2021-07-30 ENCOUNTER — Ambulatory Visit: Payer: Self-pay | Attending: Nurse Practitioner | Admitting: Nurse Practitioner

## 2021-07-30 VITALS — BP 124/86 | HR 72 | Ht 65.0 in | Wt 170.0 lb

## 2021-07-30 DIAGNOSIS — Z1231 Encounter for screening mammogram for malignant neoplasm of breast: Secondary | ICD-10-CM

## 2021-07-30 DIAGNOSIS — K8020 Calculus of gallbladder without cholecystitis without obstruction: Secondary | ICD-10-CM

## 2021-07-30 DIAGNOSIS — Z7689 Persons encountering health services in other specified circumstances: Secondary | ICD-10-CM

## 2021-07-30 NOTE — Progress Notes (Signed)
Assessment & Plan:  Katherine Fernandez was seen today for abdominal pain.  Diagnoses and all orders for this visit:  Encounter to establish care  Breast cancer screening by mammogram -     MS DIGITAL SCREENING TOMO BILATERAL; Future  Calculus of gallbladder without cholecystitis without obstruction Patient was given the number to general surgery for scheduling      Patient has been counseled on age-appropriate routine health concerns for screening and prevention. These are reviewed and up-to-date. Referrals have been placed accordingly. Immunizations are up-to-date or declined.    Subjective:   Chief Complaint  Patient presents with   Abdominal Pain   HPI Katherine Fernandez 46 y.o. female presents to office today to establish care.  She is accompanied by an onsite spanish interpreter through [employer].   She continues to endorse RUQ abdominal pain.  Ultrasound confirmed cholelithiasis on 04/03/2021 with no signs of cholecystitis.  She endorses several episodes of vomiting since her last office visit for hospital follow-up on 04/28/2021.  Reports her last episode of vomiting was 2 weeks ago. Rates abdominal pain when occurring at 10/10.  She was referred to general surgery on 04/28/2021 and it appears they attempted to reach the patient numerous times however patient states she never received any phone calls, messages or letter in the mail.  She does endorse sore throat a few weeks ago.  Took Advil, nyquil, tylenol and ibuprofen OTC products.  Denies any pain today but states she does feel discomfort in her throat when she swallows her saliva.   Review of Systems  Constitutional:  Negative for fever, malaise/fatigue and weight loss.  HENT:  Positive for sore throat. Negative for nosebleeds.   Eyes: Negative.  Negative for blurred vision, double vision and photophobia.  Respiratory: Negative.  Negative for cough and shortness of breath.   Cardiovascular: Negative.  Negative for chest pain,  palpitations and leg swelling.  Gastrointestinal:  Positive for abdominal pain and vomiting. Negative for blood in stool, constipation, diarrhea, heartburn, melena and nausea.  Genitourinary: Negative.   Musculoskeletal: Negative.  Negative for myalgias.  Neurological: Negative.  Negative for dizziness, focal weakness, seizures and headaches.  Psychiatric/Behavioral: Negative.  Negative for suicidal ideas.     Past Medical History:  Diagnosis Date   Medical history non-contributory     Past Surgical History:  Procedure Laterality Date   CESAREAN SECTION     CESAREAN SECTION N/A 08/09/2012   Procedure: REPEAT CESAREAN SECTION;  Surgeon: Kathreen Cosier, MD;  Location: [facility];  Service: Obstetrics;  Laterality: N/A;   TUBAL LIGATION Bilateral 08/09/2012   Procedure: BILATERAL TUBAL LIGATION;  Surgeon: Kathreen Cosier, MD;  Location: [facility];  Service: Obstetrics;  Laterality: Bilateral;    History reviewed. No pertinent family history.  Social History Reviewed with no changes to be made today.   Outpatient Medications Prior to Visit  Medication Sig Dispense Refill   ibuprofen (ADVIL,MOTRIN) 600 MG tablet Take 1 tablet (600 mg total) by mouth every 6 (six) hours as needed for pain. (Patient not taking: Reported on 07/30/2021) 30 tablet 5   oxyCODONE-acetaminophen (PERCOCET/ROXICET) 5-325 MG per tablet Take 1-2 tablets by mouth every 4 (four) hours as needed. (Patient not taking: Reported on 04/28/2021) 40 tablet 0   No facility-administered medications prior to visit.    No Known Allergies     Objective:    BP 124/86   Pulse 72   Ht 5\' 5"  (1.651 m)   Wt 170 lb (77.1 kg)  SpO2 98%   BMI 28.29 kg/m  Wt Readings from Last 3 Encounters:  07/30/21 170 lb (77.1 kg)  04/28/21 166 lb (75.3 kg)  04/03/21 170 lb (77.1 kg)    Physical Exam Vitals and nursing note reviewed.  Constitutional:      Appearance: She is well-developed.  HENT:     Head: Normocephalic and  atraumatic.     Salivary Glands: Right salivary gland is not diffusely enlarged or tender. Left salivary gland is not diffusely enlarged or tender.     Mouth/Throat:     Pharynx: Oropharynx is clear. Uvula midline.     Tonsils: No tonsillar exudate or tonsillar abscesses. 3+ on the right. 3+ on the left.  Cardiovascular:     Rate and Rhythm: Normal rate and regular rhythm.     Heart sounds: Normal heart sounds. No murmur heard.    No friction rub. No gallop.  Pulmonary:     Effort: Pulmonary effort is normal. No tachypnea or respiratory distress.     Breath sounds: Normal breath sounds. No decreased breath sounds, wheezing, rhonchi or rales.  Chest:     Chest wall: No tenderness.  Abdominal:     General: Bowel sounds are normal.     Palpations: Abdomen is soft.  Musculoskeletal:        General: Normal range of motion.     Cervical back: Normal range of motion.  Skin:    General: Skin is warm and dry.  Neurological:     Mental Status: She is alert and oriented to person, place, and time.     Coordination: Coordination normal.  Psychiatric:        Behavior: Behavior normal. Behavior is cooperative.        Thought Content: Thought content normal.        Judgment: Judgment normal.          Patient has been counseled extensively about nutrition and exercise as well as the importance of adherence with medications and regular follow-up. The patient was given clear instructions to go to ER or return to medical center if symptoms don't improve, worsen or new problems develop. The patient verbalized understanding.   Follow-up: Return for PAP SMEAR.   Claiborne Rigg, FNP-BC [facility] [place], [place] [phone]   07/30/2021, 12:50 PM

## 2021-07-30 NOTE — Patient Instructions (Signed)
Placed in [facility] [employer] [facility]  [address] [place], [place] [address]  PH# [phone]

## 2021-07-30 NOTE — Progress Notes (Signed)
Having pain on right side of abdomen. Right side of throat pain when swallowing.

## 2021-08-06 ENCOUNTER — Ambulatory Visit
Admission: RE | Admit: 2021-08-06 | Discharge: 2021-08-06 | Disposition: A | Discharge status: Home / Self Care | Payer: No Typology Code available for payment source | Source: Ambulatory Visit | Attending: Nurse Practitioner | Admitting: Nurse Practitioner

## 2021-08-06 DIAGNOSIS — Z1231 Encounter for screening mammogram for malignant neoplasm of breast: Secondary | ICD-10-CM

## 2021-08-30 ENCOUNTER — Other Ambulatory Visit (HOSPITAL_COMMUNITY)
Admission: RE | Admit: 2021-08-30 | Discharge: 2021-08-30 | Disposition: A | Discharge status: Home / Self Care | Payer: Self-pay | Source: Ambulatory Visit | Attending: Nurse Practitioner | Admitting: Nurse Practitioner

## 2021-08-30 ENCOUNTER — Ambulatory Visit: Payer: Self-pay | Attending: Nurse Practitioner | Admitting: Nurse Practitioner

## 2021-08-30 ENCOUNTER — Encounter: Payer: Self-pay | Admitting: Nurse Practitioner

## 2021-08-30 VITALS — BP 119/82 | HR 60 | Wt 166.6 lb

## 2021-08-30 DIAGNOSIS — Z124 Encounter for screening for malignant neoplasm of cervix: Secondary | ICD-10-CM

## 2021-08-30 DIAGNOSIS — N939 Abnormal uterine and vaginal bleeding, unspecified: Secondary | ICD-10-CM

## 2021-08-30 NOTE — Progress Notes (Signed)
Assessment & Plan:  Katherine Fernandez was seen today for gynecologic exam.  Diagnoses and all orders for this visit:  Encounter for Papanicolaou smear for cervical cancer screening -     Cervicovaginal ancillary only -     Cytology - PAP    Patient has been counseled on age-appropriate routine health concerns for screening and prevention. These are reviewed and up-to-date. Referrals have been placed accordingly. Immunizations are up-to-date or declined.    Subjective:   Chief Complaint  Patient presents with   Gynecologic Exam   HPI Katherine Fernandez 46 y.o. female presents to office today for PAP smear.   Review of Systems  Constitutional: Negative.  Negative for chills, fever, malaise/fatigue and weight loss.  Respiratory: Negative.  Negative for cough, shortness of breath and wheezing.   Cardiovascular: Negative.  Negative for chest pain, orthopnea and leg swelling.  Gastrointestinal:  Negative for abdominal pain.  Genitourinary: Negative.  Negative for flank pain.  Skin: Negative.  Negative for rash.  Psychiatric/Behavioral:  Negative for suicidal ideas.     Past Medical History:  Diagnosis Date   Medical history non-contributory     Past Surgical History:  Procedure Laterality Date   CESAREAN SECTION     CESAREAN SECTION N/A 08/09/2012   Procedure: REPEAT CESAREAN SECTION;  Surgeon: Kathreen Cosier, MD;  Location: [facility];  Service: Obstetrics;  Laterality: N/A;   TUBAL LIGATION Bilateral 08/09/2012   Procedure: BILATERAL TUBAL LIGATION;  Surgeon: Kathreen Cosier, MD;  Location: [facility];  Service: Obstetrics;  Laterality: Bilateral;    Family History  Problem Relation Age of Onset   Breast cancer Neg Hx     Social History Reviewed with no changes to be made today.   No outpatient medications prior to visit.   No facility-administered medications prior to visit.    No Known Allergies     Objective:    BP 119/82   Pulse 60   Wt 166 lb 9.6 oz (75.6 kg)    LMP  (Within Months) Comment: its been 4-5 months since last period  SpO2 95%   BMI 27.72 kg/m  Wt Readings from Last 3 Encounters:  08/30/21 166 lb 9.6 oz (75.6 kg)  07/30/21 170 lb (77.1 kg)  04/28/21 166 lb (75.3 kg)    Physical Exam Exam conducted with a chaperone present.  Constitutional:      Appearance: She is well-developed.  HENT:     Head: Normocephalic.  Cardiovascular:     Rate and Rhythm: Normal rate and regular rhythm.     Heart sounds: Normal heart sounds.  Pulmonary:     Effort: Pulmonary effort is normal.     Breath sounds: Normal breath sounds.  Abdominal:     General: Bowel sounds are normal.     Palpations: Abdomen is soft.     Hernia: There is no hernia in the left inguinal area.  Genitourinary:    Exam position: Lithotomy position.     Labia:        Right: No rash, tenderness, lesion or injury.        Left: No rash, tenderness, lesion or injury.      Vagina: Normal. No signs of injury and foreign body. No vaginal discharge, erythema, tenderness or bleeding.     Cervix: Normal.     Uterus: Not deviated and not enlarged.      Adnexa:        Right: No mass, tenderness or fullness.  Left: No mass, tenderness or fullness.       Rectum: Normal. No external hemorrhoid.  Lymphadenopathy:     Lower Body: No right inguinal adenopathy. No left inguinal adenopathy.  Skin:    General: Skin is warm and dry.  Neurological:     Mental Status: She is alert and oriented to person, place, and time.  Psychiatric:        Behavior: Behavior normal.        Thought Content: Thought content normal.        Judgment: Judgment normal.          Patient has been counseled extensively about nutrition and exercise as well as the importance of adherence with medications and regular follow-up. The patient was given clear instructions to go to ER or return to medical center if symptoms don't improve, worsen or new problems develop. The patient verbalized understanding.    Follow-up: No follow-ups on file.   Claiborne Rigg, FNP-BC [facility] [place], [place] [phone]   08/30/2021, 11:13 AM

## 2021-08-31 LAB — CERVICOVAGINAL ANCILLARY ONLY
Bacterial Vaginitis (gardnerella): NEGATIVE
Candida Glabrata: NEGATIVE
Candida Vaginitis: NEGATIVE
Chlamydia: NEGATIVE
Comment: NEGATIVE
Comment: NEGATIVE
Comment: NEGATIVE
Comment: NEGATIVE
Comment: NEGATIVE
Comment: NORMAL
Neisseria Gonorrhea: NEGATIVE
Trichomonas: NEGATIVE

## 2021-08-31 LAB — BETA HCG QUANT (REF LAB): hCG Quant: <1 m[IU]/mL

## 2021-09-02 LAB — CYTOLOGY - PAP
Comment: NEGATIVE
Diagnosis: NEGATIVE
High risk HPV: NEGATIVE

## 2021-09-22 ENCOUNTER — Ambulatory Visit (INDEPENDENT_AMBULATORY_CARE_PROVIDER_SITE_OTHER): Payer: Self-pay | Admitting: Surgery

## 2021-09-22 ENCOUNTER — Encounter: Payer: Self-pay | Admitting: Surgery

## 2021-09-22 VITALS — BP 124/79 | HR 58 | Temp 97.8°F | Ht 64.0 in | Wt 160.6 lb

## 2021-09-22 DIAGNOSIS — K8020 Calculus of gallbladder without cholecystitis without obstruction: Secondary | ICD-10-CM

## 2021-09-22 NOTE — H&P (View-Only) (Signed)
09/22/2021  Reason for Visit:  Symptomatic cholelithiasis  Requesting Provider:  Freeman Caldron, PA-C  History of Present Illness: Katherine Fernandez is a 46 y.o. female presenting for evaluation of symptomatic cholelithiasis.  She presented to the ED at [facility] on 04/03/21 with a 1 week history of RUQ pain radiating to the left chest and shoulder.  Cardiac workup was negative and she had a RUQ ultrasound which showed cholelithiasis and sludge.  Her WBC and LFTs were normal and she was able to be discharged home.  She saw her PCP on 04/28/21 and was referred for further evaluation.    The patient reports that she gets frequent milder episodes of RUQ pain, and has only had a few more severe episodes.  Has not needed to go to ER for these episodes since her visit on 04/03/21.  With the milder episodes, the main symptom is RUQ pain that radiates to her back.  With the more severe episodes, she also gets nausea and vomiting.  Denies any fevers, chills, chest pain, shortness of breath, other areas of abdominal pain, constipation or diarrhea.  Past Medical History: Past Medical History:  Diagnosis Date   Symptomatic cholelithiasis      Past Surgical History: Past Surgical History:  Procedure Laterality Date   CESAREAN SECTION     CESAREAN SECTION N/A 08/09/2012   Procedure: REPEAT CESAREAN SECTION;  Surgeon: Frederico Hamman, MD;  Location: [facility];  Service: Obstetrics;  Laterality: N/A;   TUBAL LIGATION Bilateral 08/09/2012   Procedure: BILATERAL TUBAL LIGATION;  Surgeon: Frederico Hamman, MD;  Location: [facility];  Service: Obstetrics;  Laterality: Bilateral;    Home Medications: Prior to Admission medications   Not on File    Allergies: No Known Allergies  Social History:  reports that she has never smoked. She has never used smokeless tobacco. She reports that she does not drink alcohol and does not use drugs.   Family History: Family History  Problem Relation Age of Onset    Breast cancer Neg Hx     Review of Systems: Review of Systems  Constitutional:  Negative for chills and fever.  HENT:  Negative for hearing loss.   Respiratory:  Negative for shortness of breath.   Cardiovascular:  Negative for chest pain.  Gastrointestinal:  Positive for abdominal pain, nausea and vomiting. Negative for constipation and diarrhea.  Genitourinary:  Negative for dysuria.  Musculoskeletal:  Positive for back pain.  Skin:  Negative for rash.  Neurological:  Negative for dizziness.  Psychiatric/Behavioral:  Negative for depression.     Physical Exam BP 124/79   Pulse (!) 58   Temp 97.8 F (36.6 C) (Oral)   Ht _0  (1.626 m)   Wt 160 lb 9.6 oz (72.8 kg)   LMP  (Within Months)   SpO2 100%   BMI 27.57 kg/m  CONSTITUTIONAL: No acute distress, well nourished. HEENT:  Normocephalic, atraumatic, extraocular motion intact. NECK: Trachea is midline, and there is no jugular venous distension.  RESPIRATORY:  Lungs are clear, and breath sounds are equal bilaterally. Normal respiratory effort without pathologic use of accessory muscles. CARDIOVASCULAR: Heart is regular without murmurs, gallops, or rubs. GI: The abdomen is soft, non-distended, with some mild tenderness in the RUQ and epigastric areas, with negative Murphy's sign.  MUSCULOSKELETAL:  Normal muscle strength and tone in all four extremities.  No peripheral edema or cyanosis. SKIN: Skin turgor is normal. There are no pathologic skin lesions.  NEUROLOGIC:  Motor and sensation is  grossly normal.  Cranial nerves are grossly intact. PSYCH:  Alert and oriented to person, place and time. Affect is normal.  Laboratory Analysis: Labs from 04/03/21: Na 140, K 3.4, Cl 104, CO2 26, BUN 7, Cr 0.78.  Total bili 0.8, AST 19, ALT 19, Alk Phos 72, lipase 35, Albumin 4.1.  WBC 7.3, Hgb 13.9, Hct 40.9, Plt 257  Imaging: Ultrasound RUQ 04/03/21: IMPRESSION: 1. No acute findings. 2. Gallbladder sludge and stones.  Assessment and  Plan: This is a 46 y.o. female with symptomatic cholelithiasis.  --Discussed with the patient the findings on her ultrasound and laboratory workup, and how cholelithiasis can lead to biliary colic episodes.  Currently she's been experiencing frequent though milder symptoms and she's interested in surgical management.  Discussed that unfortunately there is no good medical way or conservative way to treat this, and given the frequency of symptoms, do not think just a low fat diet would be able to truly help. --Discussed with her the plan for a robotic assisted cholecystectomy.  Reviewed the surgery at length with her including the incisions, the risks of bleeding, infection, injury to surrounding structures, the use of ICG for evaluation of the biliary anatomy, that this is an outpatient surgery, post-operative activity restrictions, and she's willing to proceed. --Will schedule her for 10/06/21.  All of her questions have been answered.  I spent 55 minutes dedicated to the care of this patient on the date of this encounter to include pre-visit review of records, face-to-face time with the patient discussing diagnosis and management, and any post-visit coordination of care.   Melvyn Neth, [facility]

## 2021-09-22 NOTE — Progress Notes (Signed)
09/22/2021  Reason for Visit:  Symptomatic cholelithiasis  Requesting Provider:  Angela McClung, PA-C  History of Present Illness: Katherine Fernandez is a 46 y.o. female presenting for evaluation of symptomatic cholelithiasis.  She presented to the ED at [facility] on 04/03/21 with a 1 week history of RUQ pain radiating to the left chest and shoulder.  Cardiac workup was negative and she had a RUQ ultrasound which showed cholelithiasis and sludge.  Her WBC and LFTs were normal and she was able to be discharged home.  She saw her PCP on 04/28/21 and was referred for further evaluation.    The patient reports that she gets frequent milder episodes of RUQ pain, and has only had a few more severe episodes.  Has not needed to go to ER for these episodes since her visit on 04/03/21.  With the milder episodes, the main symptom is RUQ pain that radiates to her back.  With the more severe episodes, she also gets nausea and vomiting.  Denies any fevers, chills, chest pain, shortness of breath, other areas of abdominal pain, constipation or diarrhea.  Past Medical History: Past Medical History:  Diagnosis Date   Symptomatic cholelithiasis      Past Surgical History: Past Surgical History:  Procedure Laterality Date   CESAREAN SECTION     CESAREAN SECTION N/A 08/09/2012   Procedure: REPEAT CESAREAN SECTION;  Surgeon: Bernard A Marshall, MD;  Location: [facility];  Service: Obstetrics;  Laterality: N/A;   TUBAL LIGATION Bilateral 08/09/2012   Procedure: BILATERAL TUBAL LIGATION;  Surgeon: Bernard A Marshall, MD;  Location: [facility];  Service: Obstetrics;  Laterality: Bilateral;    Home Medications: Prior to Admission medications   Not on File    Allergies: No Known Allergies  Social History:  reports that she has never smoked. She has never used smokeless tobacco. She reports that she does not drink alcohol and does not use drugs.   Family History: Family History  Problem Relation Age of Onset    Breast cancer Neg Hx     Review of Systems: Review of Systems  Constitutional:  Negative for chills and fever.  HENT:  Negative for hearing loss.   Respiratory:  Negative for shortness of breath.   Cardiovascular:  Negative for chest pain.  Gastrointestinal:  Positive for abdominal pain, nausea and vomiting. Negative for constipation and diarrhea.  Genitourinary:  Negative for dysuria.  Musculoskeletal:  Positive for back pain.  Skin:  Negative for rash.  Neurological:  Negative for dizziness.  Psychiatric/Behavioral:  Negative for depression.     Physical Exam BP 124/79   Pulse (!) 58   Temp 97.8 F (36.6 C) (Oral)   Ht 5' 4" (1.626 m)   Wt 160 lb 9.6 oz (72.8 kg)   LMP  (Within Months)   SpO2 100%   BMI 27.57 kg/m  CONSTITUTIONAL: No acute distress, well nourished. HEENT:  Normocephalic, atraumatic, extraocular motion intact. NECK: Trachea is midline, and there is no jugular venous distension.  RESPIRATORY:  Lungs are clear, and breath sounds are equal bilaterally. Normal respiratory effort without pathologic use of accessory muscles. CARDIOVASCULAR: Heart is regular without murmurs, gallops, or rubs. GI: The abdomen is soft, non-distended, with some mild tenderness in the RUQ and epigastric areas, with negative Murphy's sign.  MUSCULOSKELETAL:  Normal muscle strength and tone in all four extremities.  No peripheral edema or cyanosis. SKIN: Skin turgor is normal. There are no pathologic skin lesions.  NEUROLOGIC:  Motor and sensation is   grossly normal.  Cranial nerves are grossly intact. PSYCH:  Alert and oriented to person, place and time. Affect is normal.  Laboratory Analysis: Labs from 04/03/21: Na 140, K 3.4, Cl 104, CO2 26, BUN 7, Cr 0.78.  Total bili 0.8, AST 19, ALT 19, Alk Phos 72, lipase 35, Albumin 4.1.  WBC 7.3, Hgb 13.9, Hct 40.9, Plt 257  Imaging: Ultrasound RUQ 04/03/21: IMPRESSION: 1. No acute findings. 2. Gallbladder sludge and stones.  Assessment and  Plan: This is a 46 y.o. female with symptomatic cholelithiasis.  --Discussed with the patient the findings on her ultrasound and laboratory workup, and how cholelithiasis can lead to biliary colic episodes.  Currently she's been experiencing frequent though milder symptoms and she's interested in surgical management.  Discussed that unfortunately there is no good medical way or conservative way to treat this, and given the frequency of symptoms, do not think just a low fat diet would be able to truly help. --Discussed with her the plan for a robotic assisted cholecystectomy.  Reviewed the surgery at length with her including the incisions, the risks of bleeding, infection, injury to surrounding structures, the use of ICG for evaluation of the biliary anatomy, that this is an outpatient surgery, post-operative activity restrictions, and she's willing to proceed. --Will schedule her for 10/06/21.  All of her questions have been answered.  I spent 55 minutes dedicated to the care of this patient on the date of this encounter to include pre-visit review of records, face-to-face time with the patient discussing diagnosis and management, and any post-visit coordination of care.   [name], MD [facility]    

## 2021-09-22 NOTE — Patient Instructions (Signed)
Our surgery scheduler Britta Mccreedy will call you within 24-48 hours to get you scheduled. If you have not heard from her after 48 hours, please call our office. Have the blue sheet available when she calls to write down important information.   If you have any concerns or questions, please feel free to call our office.   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 La vescula biliar es un rgano que tiene forma de pera y se encuentra debajo del hgado, del lado derecho del cuerpo. La vescula biliar almacena bilis, un lquido que ayuda al organismo a digerir las grasas. La colecistectoma se realiza con frecuencia para tratar la inflamacin (irritacin e hinchazn) de la vescula biliar (colecistitis). Por lo general, esta afeccin se debe a una acumulacin de clculos biliares (colelitiasis) en la vescula biliar o al estancamiento del lquido de la vescula biliar a causa de que los clculos biliares se atascan en los conductos (tubos) y obstruyen el paso de la bilis. Esto puede producir inflamacin y Engineer, mining. En los [employer], podr ser [place]. Este procedimiento se realiza a travs de pequeas incisiones en el abdomen, en lugar de una incisin grande. Tambin se denomina "ciruga laparoscpica". Se introduce un endoscopio delgado que tiene Secretary/administrator (laparoscopio) a travs de una incisin. A travs de las otras incisiones, se introducen los instrumentos quirrgicos. En algunos casos, es posible que [place] mnimamente invasiva deba cambiarse a una [place] realizada a travs de una incisin ms grande. Esta se denomina "ciruga abierta". Informe al mdico acerca de lo siguiente: Cualquier alergia que tenga. Todos los [employer] [place], incluidos vitaminas, hierbas, gotas oftlmicas, cremas y [address] de [address]. Problemas previos  que usted o algn miembro de su familia hayan tenido con los anestsicos. Cualquier problema de la sangre que tenga. Cirugas a las que se haya sometido. Cualquier afeccin mdica que tenga. Si est embarazada o podra estarlo. Cules son los riesgos? En general, se trata de un procedimiento seguro. Sin embargo, pueden ocurrir complicaciones, por ejemplo: Infeccin. Sangrado. Reacciones alrgicas a los medicamentos. Daos a las estructuras o los rganos cercanos. Un clculo biliar que queda en el conducto biliar comn. El conducto coldoco transporta la bilis desde la vescula biliar hasta el intestino delgado. Una filtracin de bilis del hgado o del conducto qustico despus de que se extirpa la vescula biliar. Qu ocurre antes del procedimiento? Cundo dejar de comer y beber Siga las instrucciones del mdico con respecto a lo que puede comer y beber antes del procedimiento. Pueden incluir: Ocho horas antes del procedimiento Deje de comer la mayora de los alimentos. No coma carne, alimentos fritos ni alimentos grasos. Consuma solo alimentos livianos, como tostadas o Social worker. Todos los lquidos son aceptables, excepto las bebidas energticas y el alcohol. Seis horas antes del procedimiento Deje de comer. Beba nicamente lquidos transparentes, como agua, jugo de fruta transparente, caf solo, t solo y bebidas deportivas. No consuma bebidas energticas ni alcohol. Dos horas antes del procedimiento Deje de beber todos los lquidos. Es posible que le permitan tomar medicamentos con pequeos sorbos de [place]. Si no sigue las instrucciones del mdico, el procedimiento puede retrasarse o cancelarse. Medicamentos Consulte al mdico si debe hacer o no lo siguiente: Multimedia programmer o suspender los medicamentos que [place] habitualmente. Esto es muy importante si toma medicamentos para la diabetes o anticoagulantes. Tomar medicamentos como aspirina e ibuprofeno. Estos medicamentos pueden [employer]  un Brewing technologist. No tome estos medicamentos a menos que el mdico se lo indique. Usar medicamentos de venta libre, vitaminas, hierbas y suplementos. Instrucciones generales Si va a marcharse a su casa inmediatamente despus del procedimiento, pdale a un adulto responsable que: Lo lleve a su casa desde el hospital o la clnica. No se le permitir conducir. Lo cuide durante el [employer] indiquen. No consuma ningn producto que contenga nicotina ni tabaco durante al [employer] las 4 semanas anteriores al procedimiento. Estos productos incluyen cigarrillos, tabaco para Theatre manager y aparatos de vapeo, como los Administrator, Civil Service. Si necesita ayuda para dejar de fumar, consulte al [employer]. Pregntele al mdico: Cmo se Forensic psychologist de la Leisure centre manager. Qu medidas se tomarn para evitar una infeccin. Pueden incluir: Rasurar el vello del lugar de la ciruga. Lavar la piel con un jabn antisptico. Recibir antibiticos. Qu ocurre durante el procedimiento?  Le colocarn una va intravenosa (i.v.) en una vena. Le administrarn uno de los siguientes medicamentos o ambos: Un medicamento para ayudar a Lexicographer (sedante). Un medicamento que lo har dormir (anestesia general). Su cirujano le har varias incisiones pequeas en el abdomen. El laparoscopio se introducir a travs de una de las pequeas incisiones. La cmara del laparoscopio enviar imgenes a un monitor que se encuentra en el quirfano. Esto permitir a su Pensions consultant del abdomen. Le inyectarn un gas en el abdomen. Esto expandir el abdomen para que el cirujano tenga ms lugar para Facilities manager. El resto del instrumental necesario para el procedimiento se introducir a travs de las otras incisiones. Se extirpar la vescula biliar a travs de una de las incisiones. Se puede examinar el conducto coldoco. Si se encuentran clculos en la va biliar, tal vez deban extirparse. Despus de la extirpacin de la  vescula biliar, se cerrarn las incisiones con puntos (suturas), grapas o goma para cerrar la piel. Las incisiones pueden cubrirse con una venda (vendaje). El procedimiento puede variar segn el mdico y el hospital. Ladell Heads ocurre despus del procedimiento? Le controlarn la presin arterial, la frecuencia cardaca, la frecuencia respiratoria y Air cabin crew de oxgeno en la sangre hasta que le den el alta del hospital o la clnica. Le darn analgsicos para Human resources officer, si es necesario. Es posible que le coloquen un drenaje en la incisin. Se lo retirarn [employer] despus del procedimiento. Resumen La colecistectoma mnimamente invasiva, tambin llamada colecistectoma laparoscpica, es una ciruga que se realiza para extirpar la vescula biliar a travs de pequeas incisiones. Informe a su mdico sobre todas las otras afecciones que tenga y [place] todos los medicamentos que est usando para dichas afecciones. Antes del procedimiento, siga las instrucciones sobre cundo dejar de comer y beber y Tacy Dura cambiar o suspender medicamentos. Haga que un adulto responsable lo cuide durante el tiempo que le indiquen despus de que le den el alta del hospital o de la clnica. Esta informacin no tiene Theme park manager el consejo del mdico. Asegrese de hacerle al mdico cualquier pregunta que tenga. Document Revised: 08/26/2020 Document Reviewed: 08/26/2020 Elsevier Patient Education  2023 [employer].

## 2021-09-23 ENCOUNTER — Telehealth: Payer: Self-pay | Admitting: Surgery

## 2021-09-23 NOTE — Telephone Encounter (Signed)
Spoke to husband, Serjio, they are all informed of the following regarding scheduled surgery for robotic cholecystectomy with Dr. Aleen Campi.   Patient has been advised of Pre-Admission date/time, and Surgery date.  Surgery Date: 10/06/21 Preadmission Testing Date: 10/01/21 (phone 8a-1p)  Patient has been made aware to call [phone], between 1-3:00pm the day before surgery, to find out what time to arrive for surgery.

## 2021-10-01 ENCOUNTER — Encounter
Admission: RE | Admit: 2021-10-01 | Discharge: 2021-10-01 | Disposition: A | Discharge status: Home / Self Care | Payer: Self-pay | Source: Ambulatory Visit | Attending: Surgery | Admitting: Surgery

## 2021-10-01 VITALS — Ht 63.0 in | Wt 160.0 lb

## 2021-10-01 DIAGNOSIS — Z01818 Encounter for other preprocedural examination: Secondary | ICD-10-CM

## 2021-10-01 NOTE — Patient Instructions (Signed)
Your procedure is scheduled on: 10/06/21 Report to DAY SURGERY DEPARTMENT LOCATED ON 2ND FLOOR MEDICAL MALL ENTRANCE. To find out your arrival time please call [phone] between 1PM - 3PM on 10/05/21.  Remember: Instructions that are not followed completely may result in serious medical risk, up to and including death, or upon the discretion of your surgeon and anesthesiologist your surgery may need to be rescheduled.     _X__ 1. Do not eat food after midnight the night before your procedure.                 No gum chewing or hard candies. You may drink clear liquids up to 2 hours                 before you are scheduled to arrive for your surgery- DO not drink clear                 liquids within 2 hours of the start of your surgery.                 Clear Liquids include:  water, apple juice without pulp, clear carbohydrate                 drink such as Clearfast or Gatorade, Black Coffee or Tea (Do not add                 anything to coffee or tea). Diabetics water only  __X__2.  On the morning of surgery brush your teeth with toothpaste and water, you                 may rinse your mouth with mouthwash if you wish.  Do not swallow any              toothpaste of mouthwash.     _X__ 3.  No Alcohol for 24 hours before or after surgery.   _X__ 4.  Do Not Smoke or use e-cigarettes For 24 Hours Prior to Your Surgery.                 Do not use any chewable tobacco products for at least 6 hours prior to                 surgery.  ____  5.  Bring all medications with you on the day of surgery if instructed.   __X__  6.  Notify your doctor if there is any change in your medical condition      (cold, fever, infections).     Do not wear jewelry, make-up, hairpins, clips or nail polish. Do not wear lotions, powders, or perfumes.  Do not shave 48 hours prior to surgery. Men may shave face and neck. Do not bring valuables to the hospital.    [facility] is not responsible for any belongings or  valuables.  Contacts, dentures/partials or body piercings may not be worn into surgery. Bring a case for your contacts, glasses or hearing aids, a denture cup will be supplied. Leave your suitcase in the car. After surgery it may be brought to your room. For patients admitted to the hospital, discharge time is determined by your treatment team.   Patients discharged the day of surgery will not be allowed to drive home.    __X__ Take these medicines the morning of surgery with A SIP OF WATER:    1. none.    ____ Fleet Enema (as directed)   ____ Use  CHG Soap/SAGE wipes as directed  ____ Use inhalers on the day of surgery  ____ Stop metformin/Janumet/Farxiga 2 days prior to surgery    ____ Take 1/2 of usual insulin dose the night before surgery. No insulin the morning          of surgery.   ____ Stop Blood Thinners Coumadin/Plavix/Xarelto/Pleta/Pradaxa/Eliquis/Effient/Aspirin  on   Or contact your Surgeon, Cardiologist or Medical Doctor regarding  ability to stop your blood thinners  __X__ Stop Anti-inflammatories 7 days before surgery such as Advil, Ibuprofen, Motrin,  BC or Goodies Powder, Naprosyn, Naproxen, Aleve, Aspirin    __X__ Stop all herbals and supplements, fish oil or vitamins for 7 days until after surgery.    ____ Bring C-Pap to the hospital.       Su procedimiento est programado para: 16/8/23 Presntese en el DEPARTAMENTO DE CIRUGA DE DA UBICADO EN LA ENTRADA DEL CENTRO MDICO DEL 2. PISO. Para averiguar su hora de llegada, llame al [phone] entre la 1:00 p. m. y las 3:00 p. m. el 15/08/23.  Recuerde: las instrucciones que no se siguen por completo Heritage manager en un riesgo mdico grave, que puede incluir la [place], o segn el criterio de su cirujano y Environmental health practitioner, es posible que sea Firefighter su Leisure centre manager.    _X__ 1. No ingiera alimentos despus de la medianoche anterior a su procedimiento.                 No masticar chicle ni  caramelos duros. Puede beber lquidos claros hasta 2 horas                 antes de su llegada programada para su [place], no beba agua clara                 lquidos dentro de las 2 horas del comienzo de [place].                 Los lquidos claros incluyen: agua, jugo de [place] sin pulpa, carbohidratos claros                 bebida como Clearfast o Gatorade, caf negro o t (no agregue                 cualquier cosa al caf o al t). Slo agua para diabticos  __X__2. En la maana de la ciruga cepllese los dientes con pasta dental y agua, [place] enjuagarse la boca con enjuague bucal si lo desea. No trague ninguna pasta de dientes o enjuague bucal.   _X__ 3. Nada de alcohol por 24 horas antes o despus de la [place].   _X__ 4. No fume ni use cigarrillos electrnicos durante las 24 horas previas a su [place].                 No use ningn producto de tabaco masticable durante al menos 6 horas antes de                 [place].  ____ 5. Traiga todos los medicamentos con usted el da de la ciruga si se lo indicaron.  __X__ 6. Notifique a su mdico si hay algn cambio en su condicin mdica (resfriado, fiebre, infecciones).   No use joyas, maquillaje, horquillas para el cabello, clips o esmalte de uas. No use lociones, talcos ni perfumes. No se afeite 48 horas antes de la [place]. Los hombres pueden [employer] cara y el cuello. No lleve objetos de valor al  hospital.  [facility] no es responsable de ninguna pertenencia u objeto de Licensed conveyancer.  No se pueden usar lentes de contacto, dentaduras postizas/parciales o perforaciones corporales en la ciruga. Traiga un estuche para sus lentes de contacto, anteojos o audfonos, se le proporcionar una copa para dentadura postiza. Deja tu maleta en el coche. Despus de la [place], es posible que lo lleven a su habitacin. Para los pacientes ingresados en el hospital, la hora del alta la determina su equipo de tratamiento  Los pacientes dados de  alta el da de la ciruga no podrn Science writer a Higher education careers adviser.   __X__ Leanora Cover medicamentos la maana de la ciruga con UN SORBO DE AGUA:  1. Ninguno   ____ Fleet Enema (segn las indicaciones)  ____ Use CHG Soap/SAGE toallitas como se indica  ____ Surveyor, mining da de la ciruga  ____ Suspender metformina/Janumet/Farxiga 2 das antes de la ciruga  ____ [employer] mitad de la dosis habitual de insulina la noche anterior a la [place]. Sin insulina por la maana          de [place]  ____ Suspenda los anticoagulantes Coumadin/Plavix/Xarelto/Pleta/Pradaxa/Eliquis/Effient/Aspirin on O comunquese con su cirujano, cardilogo o mdico acerca de la capacidad para TEFL teacher sus anticoagulantes  __X__ [employer] antiinflamatorios 7 das antes de la ciruga como Advil, Ibuprofen, Motrin, BC o Goodies Powder, Naprosyn, Naproxen, Aleve, Aspirin  __X__ Suspenda todas las hierbas y suplementos, aceite de pescado o vitaminas durante 7 das hasta despus de la [place].  ____ Lenard Forth al hospital.

## 2021-10-05 MED ORDER — GABAPENTIN 300 MG PO CAPS: 300.0000 mg | ORAL_CAPSULE | ORAL | Status: AC

## 2021-10-05 MED ORDER — BUPIVACAINE LIPOSOME 1.3 % IJ SUSP: 20.0000 mL | Freq: Once | INTRAMUSCULAR | Status: DC

## 2021-10-05 MED ORDER — CEFAZOLIN SODIUM-DEXTROSE 2-4 GM/100ML-% IV SOLN
2.0000 g | INTRAVENOUS | Status: AC
  Administered 2021-10-06: 2 g via INTRAVENOUS

## 2021-10-05 MED ORDER — FAMOTIDINE 20 MG PO TABS: 20.0000 mg | ORAL_TABLET | Freq: Once | ORAL | Status: AC

## 2021-10-05 MED ORDER — CHLORHEXIDINE GLUCONATE 0.12 % MT SOLN: 15.0000 mL | Freq: Once | OROMUCOSAL | Status: AC

## 2021-10-05 MED ORDER — INDOCYANINE GREEN 25 MG IV SOLR
2.5000 mg | INTRAVENOUS | Status: AC
  Administered 2021-10-06: 2.5 mg via INTRAVENOUS
  Filled 2021-10-05: qty 1

## 2021-10-05 MED ORDER — ORAL CARE MOUTH RINSE: 15.0000 mL | Freq: Once | OROMUCOSAL | Status: AC

## 2021-10-05 MED ORDER — CHLORHEXIDINE GLUCONATE CLOTH 2 % EX PADS: 6.0000 | MEDICATED_PAD | Freq: Once | CUTANEOUS | Status: DC

## 2021-10-05 MED ORDER — CHLORHEXIDINE GLUCONATE CLOTH 2 % EX PADS
6.0000 | MEDICATED_PAD | Freq: Once | CUTANEOUS | Status: AC
  Administered 2021-10-06: 6 via TOPICAL

## 2021-10-05 MED ORDER — LACTATED RINGERS IV SOLN: INTRAVENOUS | Status: DC

## 2021-10-05 MED ORDER — ACETAMINOPHEN 500 MG PO TABS: 1000.0000 mg | ORAL_TABLET | ORAL | Status: AC

## 2021-10-06 ENCOUNTER — Ambulatory Visit: Payer: No Typology Code available for payment source | Admitting: Anesthesiology

## 2021-10-06 ENCOUNTER — Encounter: Payer: Self-pay | Admitting: Surgery

## 2021-10-06 ENCOUNTER — Ambulatory Visit
Admission: RE | Admit: 2021-10-06 | Discharge: 2021-10-06 | Disposition: A | Discharge status: Home / Self Care | Payer: No Typology Code available for payment source | Source: Ambulatory Visit | Attending: Surgery | Admitting: Surgery

## 2021-10-06 ENCOUNTER — Other Ambulatory Visit: Payer: Self-pay

## 2021-10-06 ENCOUNTER — Encounter
Admission: RE | Disposition: A | Discharge status: Home / Self Care | Payer: Self-pay | Source: Ambulatory Visit | Attending: Surgery

## 2021-10-06 DIAGNOSIS — K8010 Calculus of gallbladder with chronic cholecystitis without obstruction: Secondary | ICD-10-CM | POA: Insufficient documentation

## 2021-10-06 DIAGNOSIS — Z01818 Encounter for other preprocedural examination: Secondary | ICD-10-CM

## 2021-10-06 DIAGNOSIS — K8020 Calculus of gallbladder without cholecystitis without obstruction: Secondary | ICD-10-CM

## 2021-10-06 LAB — POCT PREGNANCY, URINE: Preg Test, Ur: NEGATIVE

## 2021-10-06 SURGERY — CHOLECYSTECTOMY, ROBOT-ASSISTED, LAPAROSCOPIC
Anesthesia: General | Site: Abdomen

## 2021-10-06 MED ORDER — BUPIVACAINE-EPINEPHRINE (PF) 0.25% -1:200000 IJ SOLN
INTRAMUSCULAR | Status: AC
  Filled 2021-10-06: qty 30

## 2021-10-06 MED ORDER — PHENYLEPHRINE HCL-NACL 20-0.9 MG/250ML-% IV SOLN
INTRAVENOUS | Status: AC
  Filled 2021-10-06: qty 250

## 2021-10-06 MED ORDER — FENTANYL CITRATE (PF) 100 MCG/2ML IJ SOLN
INTRAMUSCULAR | Status: AC
  Filled 2021-10-06: qty 2

## 2021-10-06 MED ORDER — LIDOCAINE HCL (PF) 2 % IJ SOLN
INTRAMUSCULAR | Status: AC
  Filled 2021-10-06: qty 5

## 2021-10-06 MED ORDER — OXYCODONE HCL 5 MG/5ML PO SOLN: 5.0000 mg | Freq: Once | ORAL | Status: DC | PRN

## 2021-10-06 MED ORDER — CEFAZOLIN SODIUM-DEXTROSE 2-4 GM/100ML-% IV SOLN
INTRAVENOUS | Status: AC
  Filled 2021-10-06: qty 100

## 2021-10-06 MED ORDER — LIDOCAINE HCL (CARDIAC) PF 100 MG/5ML IV SOSY
PREFILLED_SYRINGE | INTRAVENOUS | Status: DC | PRN
  Administered 2021-10-06: 80 mg via INTRAVENOUS

## 2021-10-06 MED ORDER — FAMOTIDINE 20 MG PO TABS
ORAL_TABLET | ORAL | Status: AC
  Administered 2021-10-06: 20 mg via ORAL
  Filled 2021-10-06: qty 1

## 2021-10-06 MED ORDER — PROPOFOL 10 MG/ML IV BOLUS
INTRAVENOUS | Status: AC
  Filled 2021-10-06: qty 40

## 2021-10-06 MED ORDER — BUPIVACAINE-EPINEPHRINE (PF) 0.25% -1:200000 IJ SOLN
INTRAMUSCULAR | Status: DC | PRN
  Administered 2021-10-06: 30 mL via PERINEURAL

## 2021-10-06 MED ORDER — DEXAMETHASONE SODIUM PHOSPHATE 10 MG/ML IJ SOLN
INTRAMUSCULAR | Status: AC
  Filled 2021-10-06: qty 1

## 2021-10-06 MED ORDER — ROCURONIUM BROMIDE 10 MG/ML (PF) SYRINGE
PREFILLED_SYRINGE | INTRAVENOUS | Status: AC
  Filled 2021-10-06: qty 10

## 2021-10-06 MED ORDER — CHLORHEXIDINE GLUCONATE 0.12 % MT SOLN
OROMUCOSAL | Status: AC
  Administered 2021-10-06: 15 mL via OROMUCOSAL
  Filled 2021-10-06: qty 15

## 2021-10-06 MED ORDER — MIDAZOLAM HCL 2 MG/2ML IJ SOLN
INTRAMUSCULAR | Status: DC | PRN
  Administered 2021-10-06 (×2): 1 mg via INTRAVENOUS

## 2021-10-06 MED ORDER — KETOROLAC TROMETHAMINE 30 MG/ML IJ SOLN
INTRAMUSCULAR | Status: DC | PRN
  Administered 2021-10-06: 30 mg via INTRAVENOUS

## 2021-10-06 MED ORDER — FENTANYL CITRATE (PF) 100 MCG/2ML IJ SOLN: 25.0000 ug | INTRAMUSCULAR | Status: DC | PRN

## 2021-10-06 MED ORDER — PHENYLEPHRINE 80 MCG/ML (10ML) SYRINGE FOR IV PUSH (FOR BLOOD PRESSURE SUPPORT)
PREFILLED_SYRINGE | INTRAVENOUS | Status: DC | PRN
  Administered 2021-10-06 (×2): 80 ug via INTRAVENOUS

## 2021-10-06 MED ORDER — OXYCODONE HCL 5 MG PO TABS: 5.0000 mg | ORAL_TABLET | Freq: Once | ORAL | Status: DC | PRN

## 2021-10-06 MED ORDER — IBUPROFEN 800 MG PO TABS: 800.0000 mg | ORAL_TABLET | Freq: Three times a day (TID) | ORAL | 1 refills | Status: AC | PRN

## 2021-10-06 MED ORDER — ONDANSETRON HCL 4 MG/2ML IJ SOLN
INTRAMUSCULAR | Status: DC | PRN
  Administered 2021-10-06: 4 mg via INTRAVENOUS

## 2021-10-06 MED ORDER — GABAPENTIN 300 MG PO CAPS
ORAL_CAPSULE | ORAL | Status: AC
  Administered 2021-10-06: 300 mg via ORAL
  Filled 2021-10-06: qty 1

## 2021-10-06 MED ORDER — MIDAZOLAM HCL 2 MG/2ML IJ SOLN
INTRAMUSCULAR | Status: AC
  Filled 2021-10-06: qty 2

## 2021-10-06 MED ORDER — ROCURONIUM BROMIDE 100 MG/10ML IV SOLN
INTRAVENOUS | Status: DC | PRN
  Administered 2021-10-06: 40 mg via INTRAVENOUS
  Administered 2021-10-06: 20 mg via INTRAVENOUS

## 2021-10-06 MED ORDER — OXYCODONE HCL 5 MG PO TABS: 5.0000 mg | ORAL_TABLET | ORAL | 0 refills | Status: AC | PRN

## 2021-10-06 MED ORDER — ACETAMINOPHEN 500 MG PO TABS: 1000.0000 mg | ORAL_TABLET | Freq: Four times a day (QID) | ORAL | Status: AC | PRN

## 2021-10-06 MED ORDER — 0.9 % SODIUM CHLORIDE (POUR BTL) OPTIME
TOPICAL | Status: DC | PRN
  Administered 2021-10-06: 500 mL

## 2021-10-06 MED ORDER — DEXMEDETOMIDINE (PRECEDEX) IN NS 20 MCG/5ML (4 MCG/ML) IV SYRINGE
PREFILLED_SYRINGE | INTRAVENOUS | Status: DC | PRN
  Administered 2021-10-06: 4 ug via INTRAVENOUS
  Administered 2021-10-06: 8 ug via INTRAVENOUS
  Administered 2021-10-06: 4 ug via INTRAVENOUS

## 2021-10-06 MED ORDER — ONDANSETRON HCL 4 MG/2ML IJ SOLN
INTRAMUSCULAR | Status: AC
  Filled 2021-10-06: qty 2

## 2021-10-06 MED ORDER — FENTANYL CITRATE (PF) 100 MCG/2ML IJ SOLN
INTRAMUSCULAR | Status: DC | PRN
  Administered 2021-10-06: 100 ug via INTRAVENOUS
  Administered 2021-10-06: 25 ug via INTRAVENOUS

## 2021-10-06 MED ORDER — DEXAMETHASONE SODIUM PHOSPHATE 10 MG/ML IJ SOLN
INTRAMUSCULAR | Status: DC | PRN
  Administered 2021-10-06: 10 mg via INTRAVENOUS

## 2021-10-06 MED ORDER — PROPOFOL 10 MG/ML IV BOLUS
INTRAVENOUS | Status: DC | PRN
  Administered 2021-10-06: 150 mg via INTRAVENOUS

## 2021-10-06 MED ORDER — ACETAMINOPHEN 500 MG PO TABS
ORAL_TABLET | ORAL | Status: AC
  Administered 2021-10-06: 1000 mg via ORAL
  Filled 2021-10-06: qty 2

## 2021-10-06 MED ORDER — ONDANSETRON HCL 4 MG/2ML IJ SOLN: 4.0000 mg | Freq: Once | INTRAMUSCULAR | Status: DC | PRN

## 2021-10-06 MED ORDER — SUGAMMADEX SODIUM 200 MG/2ML IV SOLN
INTRAVENOUS | Status: DC | PRN
  Administered 2021-10-06: 100 mg via INTRAVENOUS

## 2021-10-06 SURGICAL SUPPLY — 55 items
ADH SKN CLS APL DERMABOND .7 (GAUZE/BANDAGES/DRESSINGS) ×1
BAG PRESSURE INF REUSE 1000 (BAG) IMPLANT
CANNULA REDUC XI 12-8 STAPL (CANNULA) ×2
CANNULA REDUCER 12-8 DVNC XI (CANNULA) ×2 IMPLANT
CLIP LIGATING HEMO O LOK GREEN (MISCELLANEOUS) ×3 IMPLANT
CUP MEDICINE 2OZ PLAST GRAD ST (MISCELLANEOUS) ×3 IMPLANT
DERMABOND ADVANCED (GAUZE/BANDAGES/DRESSINGS) ×1
DERMABOND ADVANCED .7 DNX12 (GAUZE/BANDAGES/DRESSINGS) ×2 IMPLANT
DRAPE ARM DVNC X/XI (DISPOSABLE) ×8 IMPLANT
DRAPE COLUMN DVNC XI (DISPOSABLE) ×2 IMPLANT
DRAPE DA VINCI XI ARM (DISPOSABLE) ×8
DRAPE DA VINCI XI COLUMN (DISPOSABLE) ×2
ELECT CAUTERY BLADE TIP 2.5 (TIP) ×2
ELECT REM PT RETURN 9FT ADLT (ELECTROSURGICAL) ×2
ELECTRODE CAUTERY BLDE TIP 2.5 (TIP) ×2 IMPLANT
ELECTRODE REM PT RTRN 9FT ADLT (ELECTROSURGICAL) ×2 IMPLANT
GLOVE SURG SYN 7.0 (GLOVE) ×4 IMPLANT
GLOVE SURG SYN 7.0 PF PI (GLOVE) ×4 IMPLANT
GLOVE SURG SYN 7.5  E (GLOVE) ×4
GLOVE SURG SYN 7.5 E (GLOVE) ×2 IMPLANT
GLOVE SURG SYN 7.5 PF PI (GLOVE) ×4 IMPLANT
GOWN STRL REUS W/ TWL LRG LVL3 (GOWN DISPOSABLE) ×8 IMPLANT
GOWN STRL REUS W/TWL LRG LVL3 (GOWN DISPOSABLE) ×8
IRRIGATOR SUCT 8 DISP DVNC XI (IRRIGATION / IRRIGATOR) IMPLANT
IRRIGATOR SUCTION 8MM XI DISP (IRRIGATION / IRRIGATOR)
IV NS 1000ML (IV SOLUTION)
IV NS 1000ML BAXH (IV SOLUTION) IMPLANT
KIT PINK PAD W/HEAD ARE REST (MISCELLANEOUS) ×2
KIT PINK PAD W/HEAD ARM REST (MISCELLANEOUS) ×2 IMPLANT
LABEL OR SOLS (LABEL) ×3 IMPLANT
MANIFOLD NEPTUNE II (INSTRUMENTS) ×3 IMPLANT
NEEDLE HYPO 22GX1.5 SAFETY (NEEDLE) ×3 IMPLANT
NS IRRIG 500ML POUR BTL (IV SOLUTION) ×3 IMPLANT
OBTURATOR OPTICAL STANDARD 8MM (TROCAR) ×2
OBTURATOR OPTICAL STND 8 DVNC (TROCAR) ×1
OBTURATOR OPTICALSTD 8 DVNC (TROCAR) ×2 IMPLANT
PACK LAP CHOLECYSTECTOMY (MISCELLANEOUS) ×3 IMPLANT
SEAL CANN UNIV 5-8 DVNC XI (MISCELLANEOUS) ×6 IMPLANT
SEAL XI 5MM-8MM UNIVERSAL (MISCELLANEOUS) ×6
SET TUBE SMOKE EVAC HIGH FLOW (TUBING) ×3 IMPLANT
SOLUTION ELECTROLUBE (MISCELLANEOUS) ×3 IMPLANT
SPIKE FLUID TRANSFER (MISCELLANEOUS) ×3 IMPLANT
SPONGE T-LAP 18X18 ~~LOC~~+RFID (SPONGE) IMPLANT
SPONGE T-LAP 4X18 ~~LOC~~+RFID (SPONGE) ×3 IMPLANT
STAPLER CANNULA SEAL DVNC XI (STAPLE) ×2 IMPLANT
STAPLER CANNULA SEAL XI (STAPLE) ×2
SUT MNCRL AB 4-0 PS2 18 (SUTURE) ×3 IMPLANT
SUT VIC AB 3-0 SH 27 (SUTURE) ×2
SUT VIC AB 3-0 SH 27X BRD (SUTURE) IMPLANT
SUT VICRYL 0 AB UR-6 (SUTURE) ×6 IMPLANT
SYS BAG RETRIEVAL 10MM (BASKET) ×2
SYSTEM BAG RETRIEVAL 10MM (BASKET) ×2 IMPLANT
TAPE TRANSPORE STRL 2 31045 (GAUZE/BANDAGES/DRESSINGS) ×3 IMPLANT
TRAP FLUID SMOKE EVACUATOR (MISCELLANEOUS) ×3 IMPLANT
WATER STERILE IRR 500ML POUR (IV SOLUTION) ×3 IMPLANT

## 2021-10-06 NOTE — Anesthesia Procedure Notes (Signed)
Procedure Name: Intubation Date/Time: 10/06/2021 7:33 AM  Performed by: Tammi Klippel, CRNAPre-anesthesia Checklist: Patient identified, Patient being monitored, Timeout performed, Emergency Drugs available and Suction available Patient Re-evaluated:Patient Re-evaluated prior to induction Oxygen Delivery Method: Circle system utilized Preoxygenation: Pre-oxygenation with 100% oxygen Induction Type: IV induction Ventilation: Mask ventilation without difficulty Laryngoscope Size: Miller and 2 Grade View: Grade I Tube type: Oral Tube size: 6.5 mm Number of attempts: 1 Airway Equipment and Method: Stylet Placement Confirmation: ETT inserted through vocal cords under direct vision, positive ETCO2 and breath sounds checked- equal and bilateral Secured at: 20 cm Tube secured with: Tape Dental Injury: Teeth and Oropharynx as per pre-operative assessment

## 2021-10-06 NOTE — Op Note (Signed)
  Procedure Date:  10/06/2021  Pre-operative Diagnosis:  Symptomatic cholelithiasis  Post-operative Diagnosis: Symptomatic cholelithiasis  Procedure:  Robotic assisted cholecystectomy with ICG FireFly cholangiogram  Surgeon:  Howie Ill, MD  Anesthesia:  General endotracheal  Estimated Blood Loss:  10 ml  Specimens:  gallbladder  Complications:  None  Indications for Procedure:  This is a 46 y.o. female who presents with abdominal pain and workup revealing symptomatic cholelithiasis.  The benefits, complications, treatment options, and expected outcomes were discussed with the patient. The risks of bleeding, infection, recurrence of symptoms, failure to resolve symptoms, bile duct damage, bile duct leak, retained common bile duct stone, bowel injury, and need for further procedures were all discussed with the patient and she was willing to proceed.  Description of Procedure: The patient was correctly identified in the preoperative area and brought into the operating room.  The patient was placed supine with VTE prophylaxis in place.  Appropriate time-outs were performed.  Anesthesia was induced and the patient was intubated.  Appropriate antibiotics were infused.  The abdomen was prepped and draped in a sterile fashion. An infraumbilical incision was made. A cutdown technique was used to enter the abdominal cavity without injury, and a 12 mm robotic port was inserted.  Pneumoperitoneum was obtained with appropriate opening pressures.  Three 8-mm ports were placed in the mid abdomen at the level of the umbilicus under direct visualization.  The DaVinci platform was docked, camera targeted, and instruments were placed under direct visualization.  The gallbladder was identified.  The fundus was grasped and retracted cephalad.  Adhesions were lysed bluntly and with electrocautery. The infundibulum was grasped and retracted laterally, exposing the peritoneum overlying the gallbladder.  This  was incised with electrocautery and extended on either side of the gallbladder.  FireFly cholangiogram was then obtained, and we were able to clearly identify the cystic duct and common bile duct.  The cystic duct and cystic artery were carefully dissected with combination of cautery and blunt dissection.  The cystic artery itself was very short and split early into anterior and posterior branches.  Both branches and the cystic duct were clipped twice proximally and once distally, cutting in between.  The gallbladder was taken from the gallbladder fossa in a retrograde fashion with electrocautery. The gallbladder was placed in an Endocatch bag. The liver bed was inspected and any bleeding was controlled with electrocautery. The right upper quadrant was then inspected again revealing intact clips, no bleeding, and no ductal injury.  The area was thoroughly irrigated.  The 8 mm ports were removed under direct visualization and the 12 mm port was removed.  The Endocatch bag was brought out via the umbilical incision. The fascial opening was closed using 0 vicryl suture.  Local anesthetic was infused in all incisions and the incisions were closed with 4-0 Monocryl.  The wounds were cleaned and sealed with DermaBond.  The patient was emerged from anesthesia and extubated and brought to the recovery room for further management.  The patient tolerated the procedure well and all counts were correct at the end of the case.   Howie Ill, MD

## 2021-10-06 NOTE — Discharge Instructions (Signed)
AMBULATORY SURGERY  ?DISCHARGE INSTRUCTIONS ? ? ?The drugs that you were given will stay in your system until tomorrow so for the next 24 hours you should not: ? ?Drive an automobile ?Make any legal decisions ?Drink any alcoholic beverage ? ? ?You may resume regular meals tomorrow.  Today it is better to start with liquids and gradually work up to solid foods. ? ?You may eat anything you prefer, but it is better to start with liquids, then soup and crackers, and gradually work up to solid foods. ? ? ?Please notify your doctor immediately if you have any unusual bleeding, trouble breathing, redness and pain at the surgery site, drainage, fever, or pain not relieved by medication. ? ? ? ?Additional Instructions: ? ? ? ?Please contact your physician with any problems or Same Day Surgery at [phone], Monday through Friday 6 am to 4 pm, or [place] [facility] [place] Main number at [phone].  ?

## 2021-10-06 NOTE — Anesthesia Preprocedure Evaluation (Signed)
Anesthesia Evaluation  Patient identified by MRN, date of birth, ID band Patient awake    Reviewed: Allergy & Precautions, NPO status , Patient's Chart, lab work & pertinent test results  History of Anesthesia Complications Negative for: history of anesthetic complications  Airway Mallampati: II  TM Distance: >3 FB Neck ROM: Full    Dental no notable dental hx. (+) Teeth Intact   Pulmonary neg pulmonary ROS, neg sleep apnea, neg COPD, Patient abstained from smoking.Not current smoker,    Pulmonary exam normal breath sounds clear to auscultation       Cardiovascular Exercise Tolerance: Good METS(-) hypertension(-) CAD and (-) Past MI negative cardio ROS  (-) dysrhythmias  Rhythm:Regular Rate:Normal - Systolic murmurs    Neuro/Psych negative neurological ROS  negative psych ROS   GI/Hepatic neg GERD  ,(+)     (-) substance abuse  ,   Endo/Other  neg diabetes  Renal/GU negative Renal ROS     Musculoskeletal   Abdominal   Peds  Hematology   Anesthesia Other Findings Past Medical History: No date: Symptomatic cholelithiasis  Reproductive/Obstetrics                             Anesthesia Physical Anesthesia Plan  ASA: 2  Anesthesia Plan: General   Post-op Pain Management: Tylenol PO (pre-op)*, Gabapentin PO (pre-op)* and Toradol IV (intra-op)*   Induction: Intravenous  PONV Risk Score and Plan: 4 or greater and Ondansetron, Dexamethasone and Midazolam  Airway Management Planned: Oral ETT  Additional Equipment: None  Intra-op Plan:   Post-operative Plan: Extubation in OR  Informed Consent: I have reviewed the patients History and Physical, chart, labs and discussed the procedure including the risks, benefits and alternatives for the proposed anesthesia with the patient or authorized representative who has indicated his/her understanding and acceptance.     Dental advisory  given  Plan Discussed with: CRNA and Surgeon  Anesthesia Plan Comments: (Discussed risks of anesthesia with patient, including PONV, sore throat, lip/dental/eye damage. Rare risks discussed as well, such as cardiorespiratory and neurological sequelae, and allergic reactions. Discussed the role of CRNA in patient's perioperative care. Patient understands.)        Anesthesia Quick Evaluation

## 2021-10-06 NOTE — Interval H&P Note (Signed)
History and Physical Interval Note:  10/06/2021 7:17 AM  Katherine Fernandez  has presented today for surgery, with the diagnosis of Symptomatic cholelithiasis.  The various methods of treatment have been discussed with the patient and family. After consideration of risks, benefits and other options for treatment, the patient has consented to  Procedure(s): XI ROBOTIC ASSISTED LAPAROSCOPIC CHOLECYSTECTOMY (N/A) INDOCYANINE GREEN FLUORESCENCE IMAGING (ICG) (N/A) as a surgical intervention.  The patient's history has been reviewed, patient examined, no change in status, stable for surgery.  I have reviewed the patient's chart and labs.  Questions were answered to the patient's satisfaction.     Katherine Fernandez

## 2021-10-06 NOTE — Transfer of Care (Signed)
Immediate Anesthesia Transfer of Care Note  Patient: Katherine Fernandez  Procedure(s) Performed: XI ROBOTIC ASSISTED LAPAROSCOPIC CHOLECYSTECTOMY (Abdomen) INDOCYANINE GREEN FLUORESCENCE IMAGING (ICG) (Abdomen)  Patient Location: PACU  Anesthesia Type:General  Level of Consciousness: drowsy  Airway & Oxygen Therapy: Patient Spontanous Breathing and Patient connected to face mask oxygen  Post-op Assessment: Report given to RN and Post -op Vital signs reviewed and stable  Post vital signs: Reviewed and stable  Last Vitals:  Vitals Value Taken Time  BP 99/56 10/06/21 0936  Temp    Pulse 61 10/06/21 0936  Resp 18 10/06/21 0936  SpO2 100 % 10/06/21 0936    Last Pain:  Vitals:   10/06/21 0623  TempSrc: Temporal  PainSc: 3          Complications: No notable events documented.

## 2021-10-07 LAB — SURGICAL PATHOLOGY

## 2021-10-07 NOTE — Anesthesia Postprocedure Evaluation (Signed)
Anesthesia Post Note  Patient: Katherine Fernandez  Procedure(s) Performed: XI ROBOTIC ASSISTED LAPAROSCOPIC CHOLECYSTECTOMY (Abdomen) INDOCYANINE GREEN FLUORESCENCE IMAGING (ICG) (Abdomen)  Patient location during evaluation: PACU Anesthesia Type: General Level of consciousness: awake and alert Pain management: pain level controlled Vital Signs Assessment: post-procedure vital signs reviewed and stable Respiratory status: spontaneous breathing, nonlabored ventilation, respiratory function stable and patient connected to nasal cannula oxygen Cardiovascular status: blood pressure returned to baseline and stable Postop Assessment: no apparent nausea or vomiting Anesthetic complications: no   No notable events documented.   Last Vitals:  Vitals:   10/06/21 1006 10/06/21 1017  BP:  121/68  Pulse: 75 72  Resp: 15   Temp: (!) 36.1 C (!) 36.2 C  SpO2: 100% 100%    Last Pain:  Vitals:   10/06/21 1017  TempSrc: Temporal  PainSc: 0-No pain                 Corinda Gubler

## 2021-10-20 ENCOUNTER — Encounter: Payer: Self-pay | Admitting: Surgery

## 2021-10-20 ENCOUNTER — Ambulatory Visit (INDEPENDENT_AMBULATORY_CARE_PROVIDER_SITE_OTHER): Payer: Self-pay | Admitting: Surgery

## 2021-10-20 VITALS — BP 129/72 | HR 62 | Temp 98.2°F | Wt 154.8 lb

## 2021-10-20 DIAGNOSIS — K8020 Calculus of gallbladder without cholecystitis without obstruction: Secondary | ICD-10-CM

## 2021-10-20 DIAGNOSIS — Z09 Encounter for follow-up examination after completed treatment for conditions other than malignant neoplasm: Secondary | ICD-10-CM

## 2021-10-20 NOTE — Progress Notes (Signed)
10/20/2021  HPI: Katherine Fernandez is a 46 y.o. female s/p robotic assisted cholecystectomy on 10/06/2021.  Patient presents today for follow-up.  She reports that she has been doing well except that she is having some discomfort in the right abdomen near one of the incisions but also in the right upper quadrant.  She is also been experiencing reflux symptoms.  Vital signs: BP 129/72   Pulse 62   Temp 98.2 F (36.8 C) (Oral)   Wt 154 lb 12.8 oz (70.2 kg)   LMP  (LMP Unknown)   SpO2 99%   BMI 27.42 kg/m    Physical Exam: Constitutional: No acute distress Abdomen: Soft, nondistended, with some tenderness to palpation in the right upper quadrant and towards the right most incision.  Incisions are healing well and are clean, dry, intact without any evidence of hernia formation.  Assessment/Plan: This is a 46 y.o. female s/p robotic assisted cholecystectomy.  - Discussed with the patient that likely the symptoms that she is experiencing are related to acid reflux.  Discussed with her that she can start taking Prilosec OTC 1 tablet daily to help with the acid reflux and symptoms.  I think the discomfort in the right upper quadrant may actually be related to acid reflux issues if she is getting some gastritis.  The Prilosec should help with this.  Discussed also avoiding spicy foods and also citric foods or acidic foods. - Discussed with her that if symptoms do not improve, to contact us for further evaluation. - Follow-up as needed.   Howie Ill, MD [facility]

## 2021-10-20 NOTE — Patient Instructions (Signed)

## 2023-07-30 IMAGING — CR DG CHEST 2V
2 series · 2 of 2 positions shown · non-contrast
Comparison: None.

CLINICAL DATA: Chest pain

EXAM:
CHEST - 2 VIEW

[chest pa]
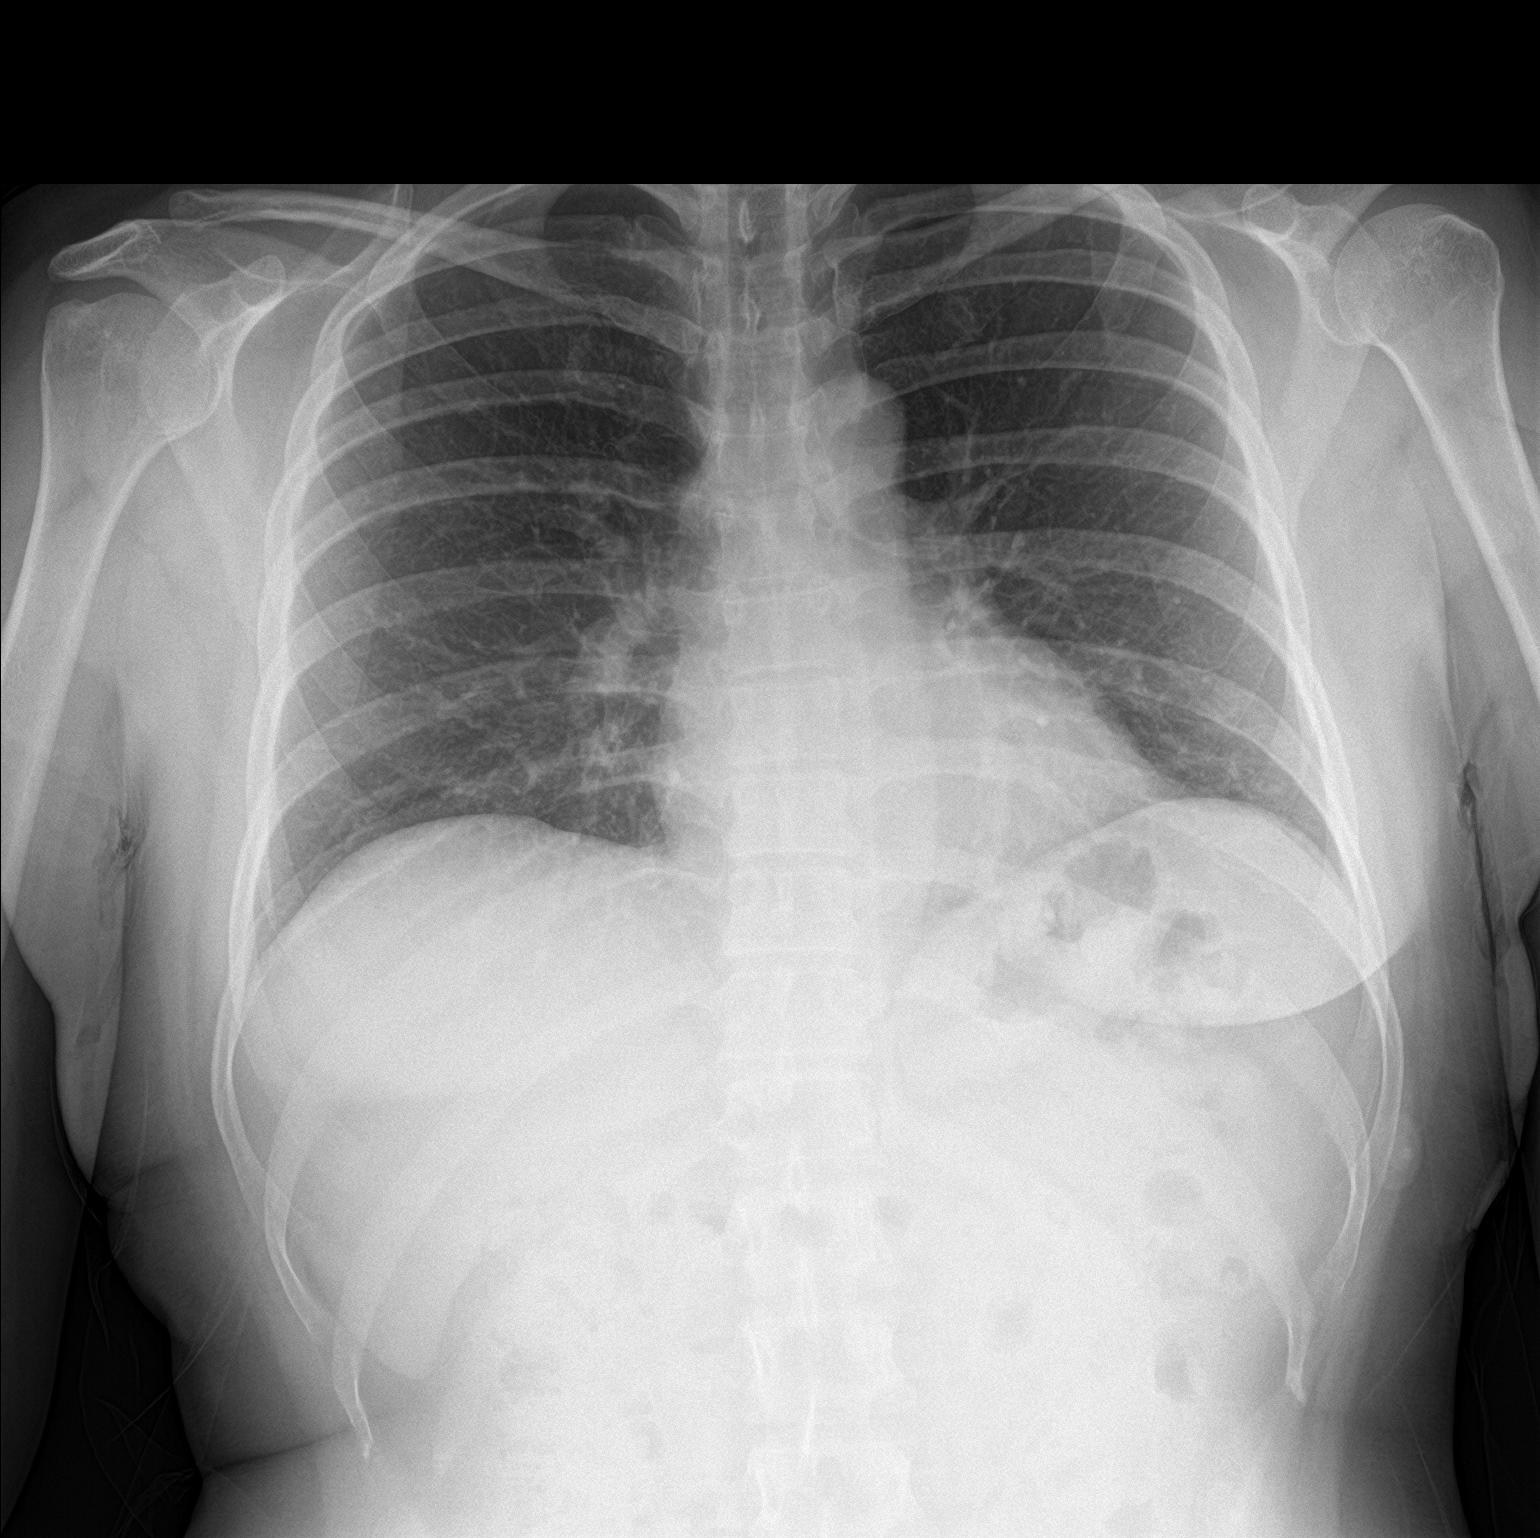

[chest lat]
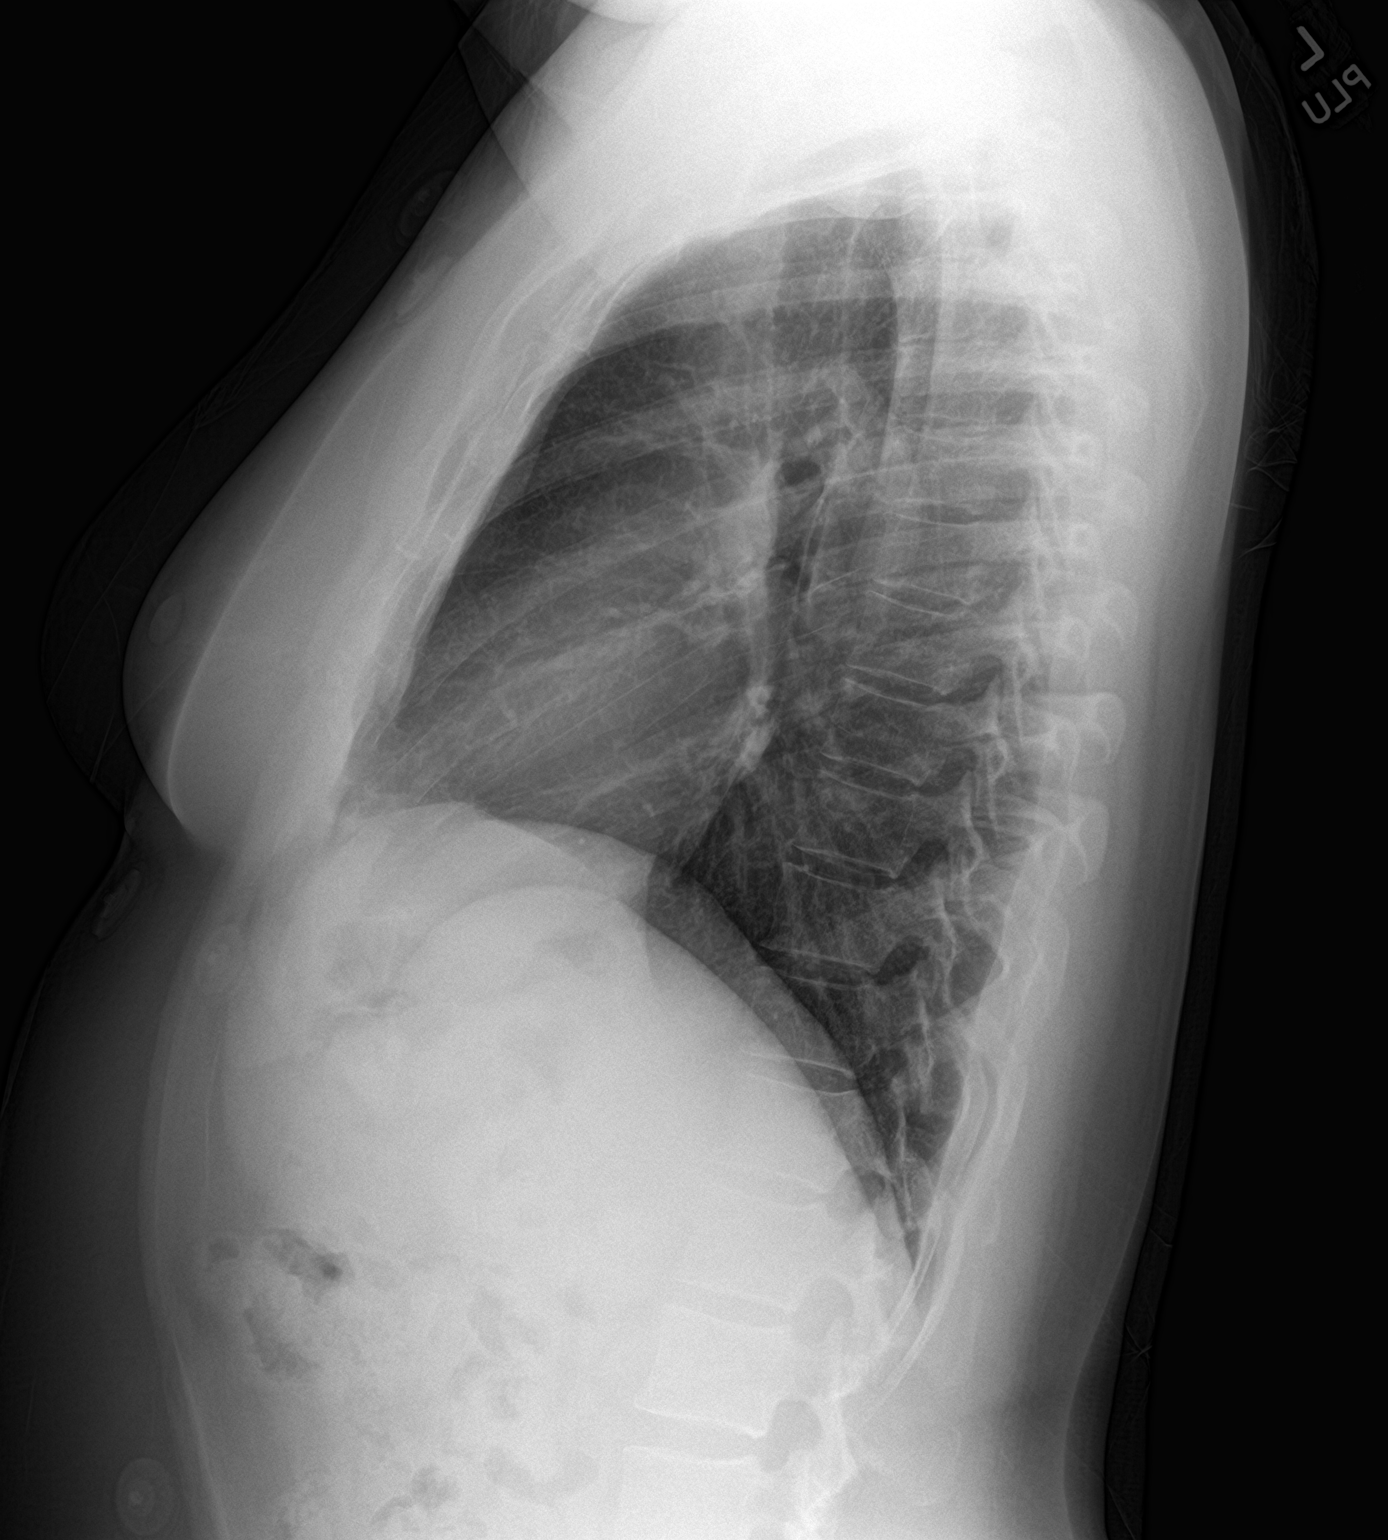

[2 of 2 positions shown; findings below may reference images not displayed]

FINDINGS: Normal heart size and mediastinal contours. No acute infiltrate or
edema. No effusion or pneumothorax. No acute osseous findings.
IMPRESSION: Negative chest.

## 2023-07-30 IMAGING — US US ABDOMEN LIMITED
1 series · 14 of 25 positions shown · non-contrast
Comparison: None.

CLINICAL DATA: Pain

EXAM:
ULTRASOUND ABDOMEN LIMITED RIGHT UPPER QUADRANT

[Series 1: us abdomen limited ruq (liver/gb) · 14 of 40 slices shown]
[im 1/40]
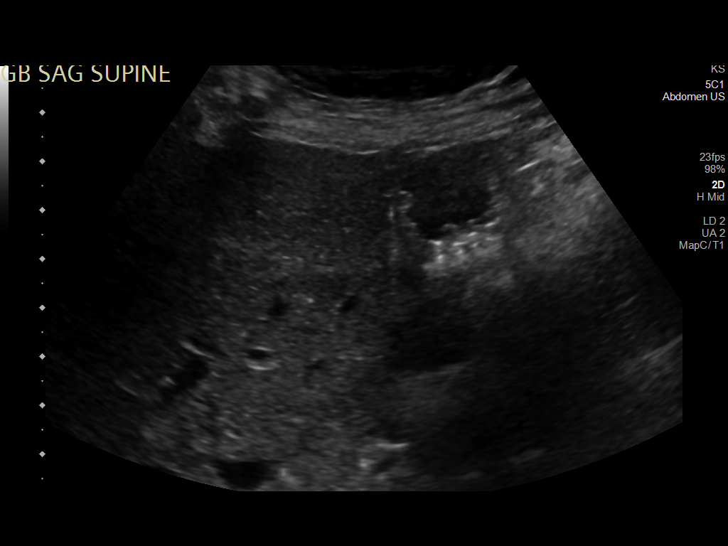
[im 4/40]
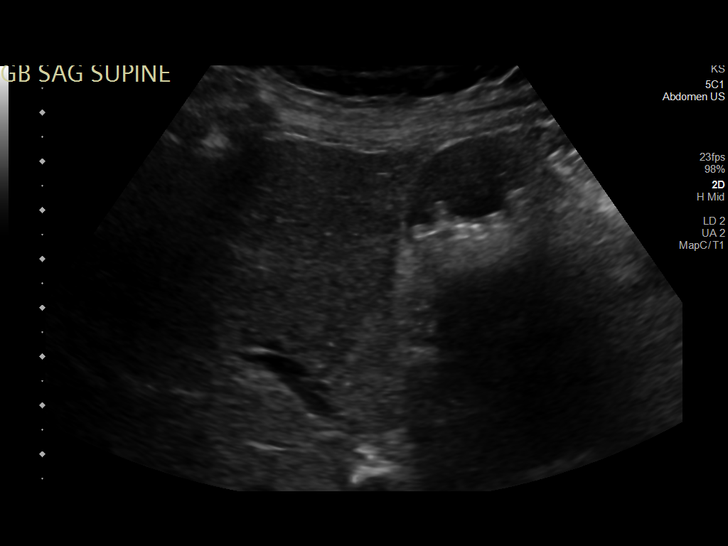
[im 7/40]
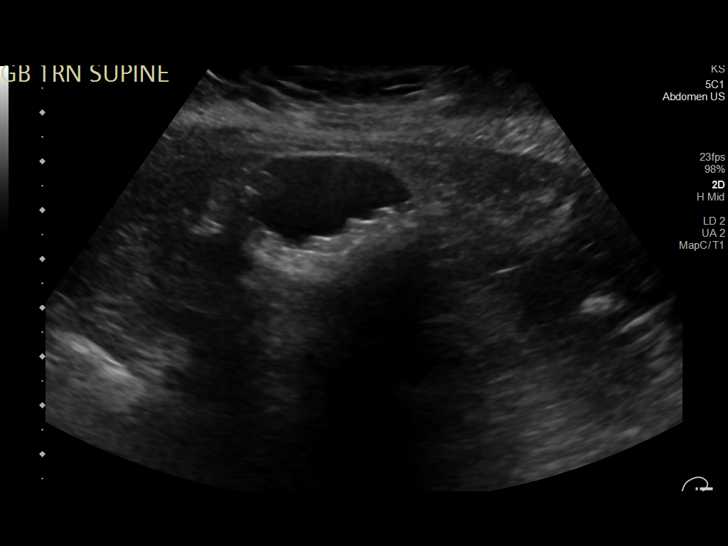
[im 10/40]
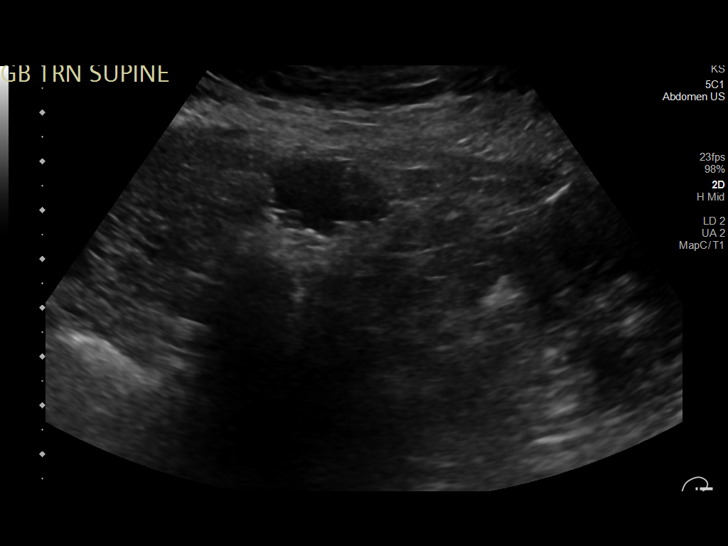
[im 14/40]
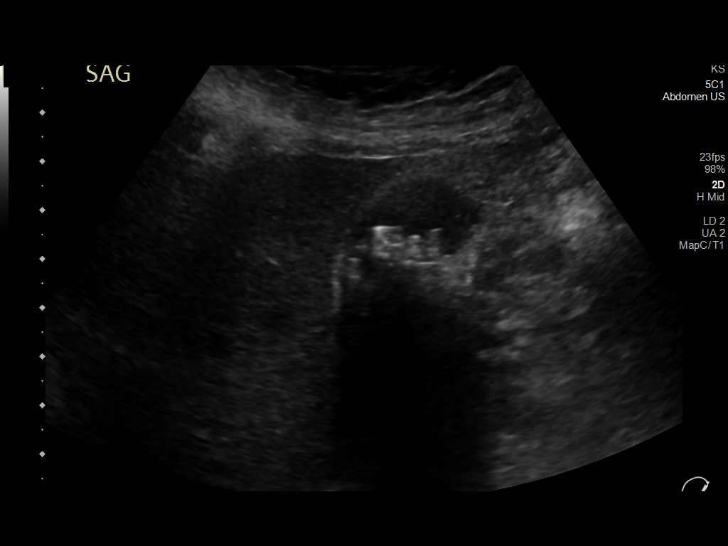
[im 15/40]
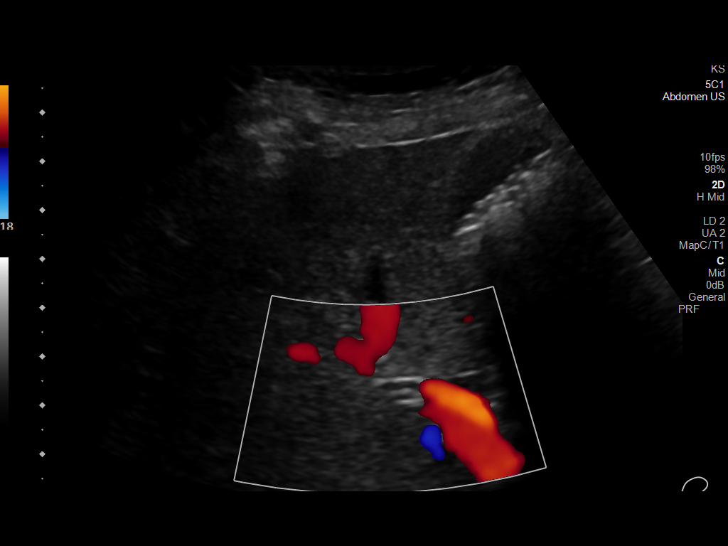
[im 18/40]
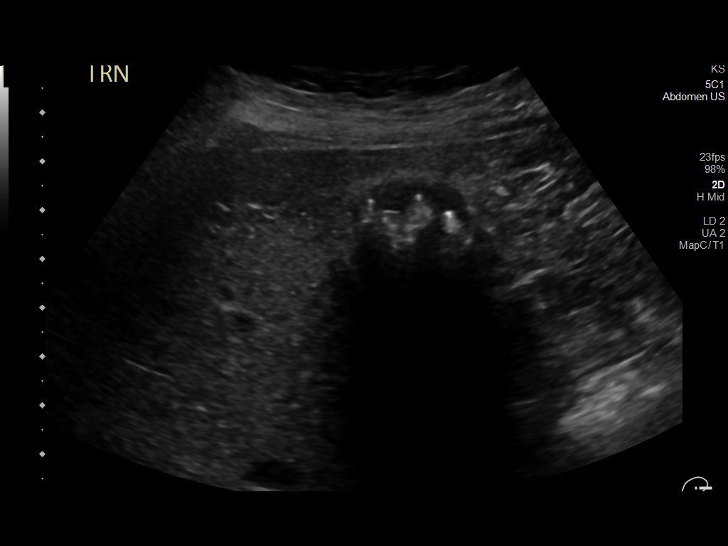
[im 22/40]
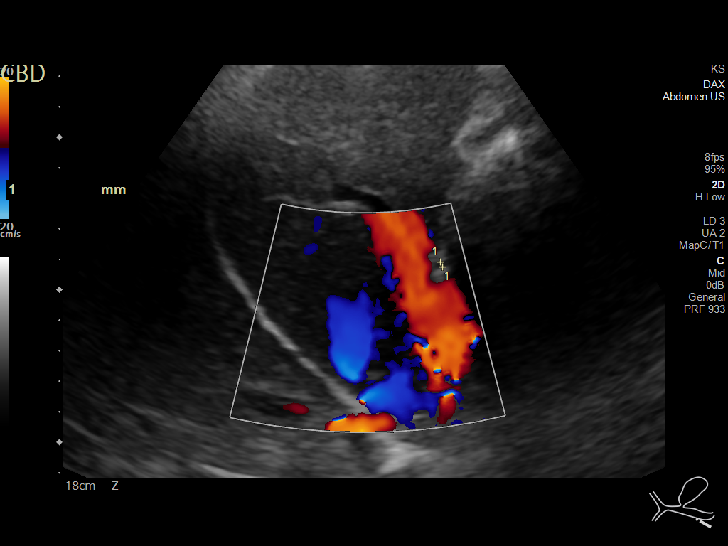
[im 25/40]
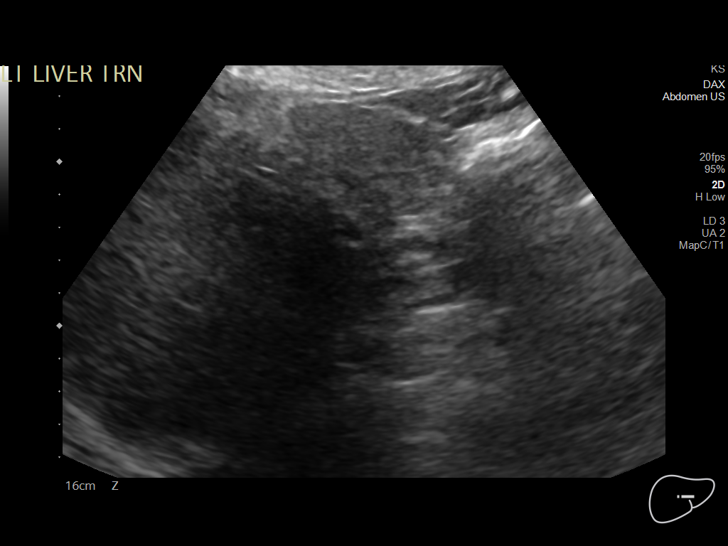
[im 27/40]
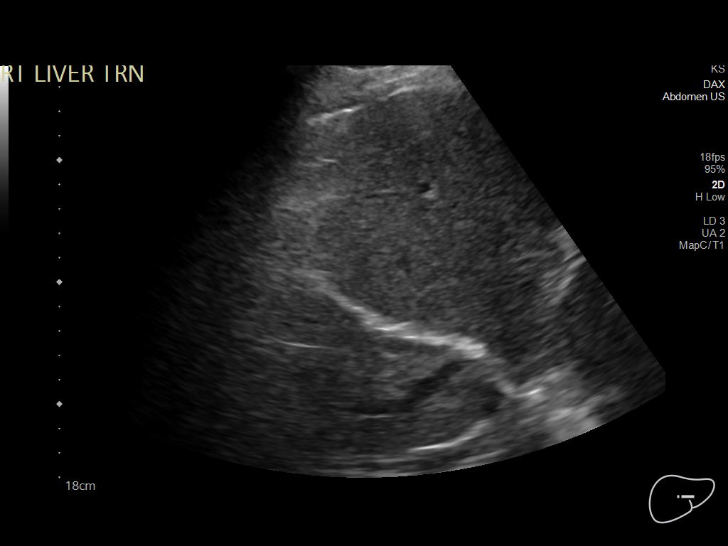
[im 30/40]
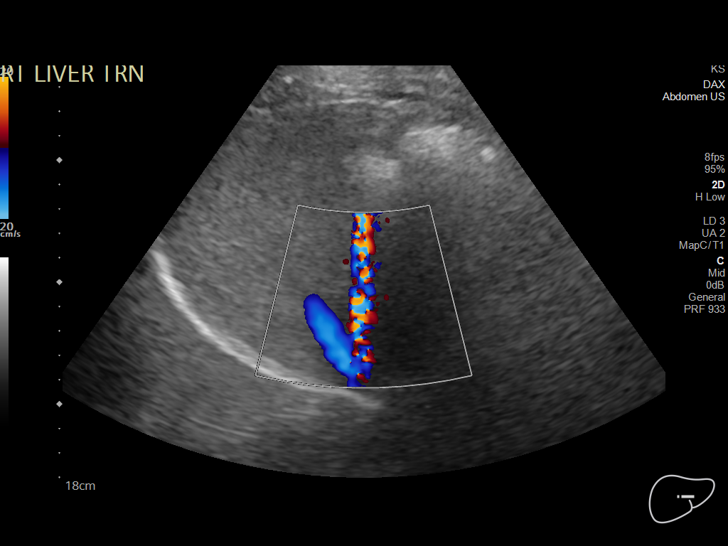
[im 33/40]
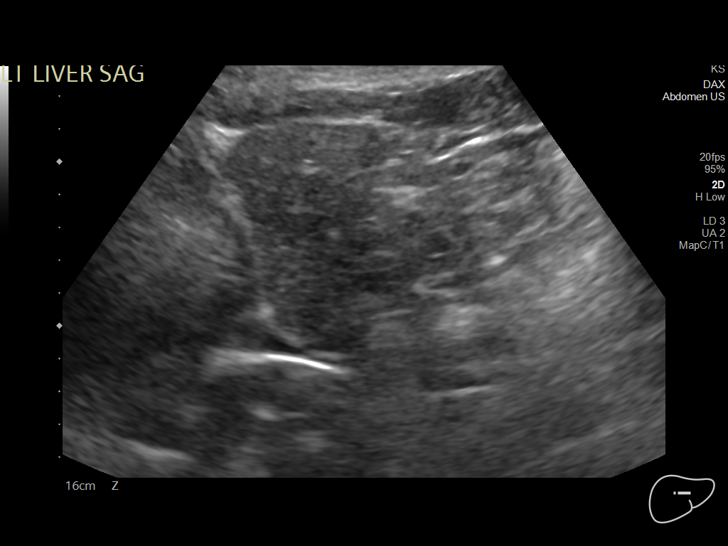
[im 36/40]
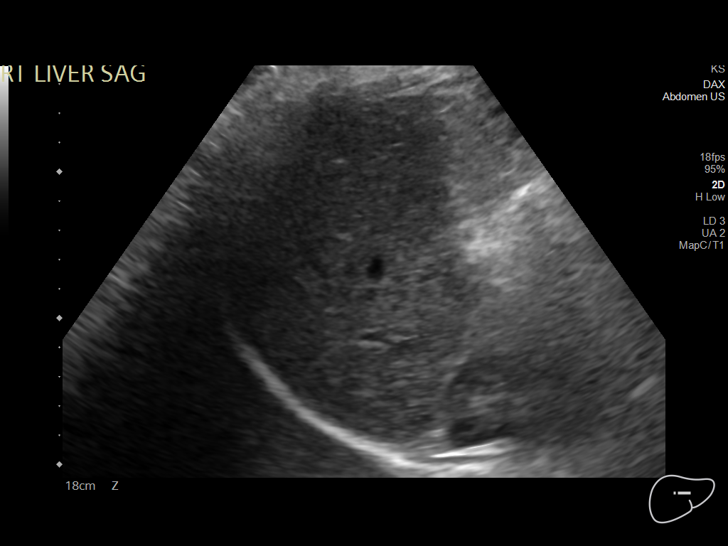
[im 40/40]
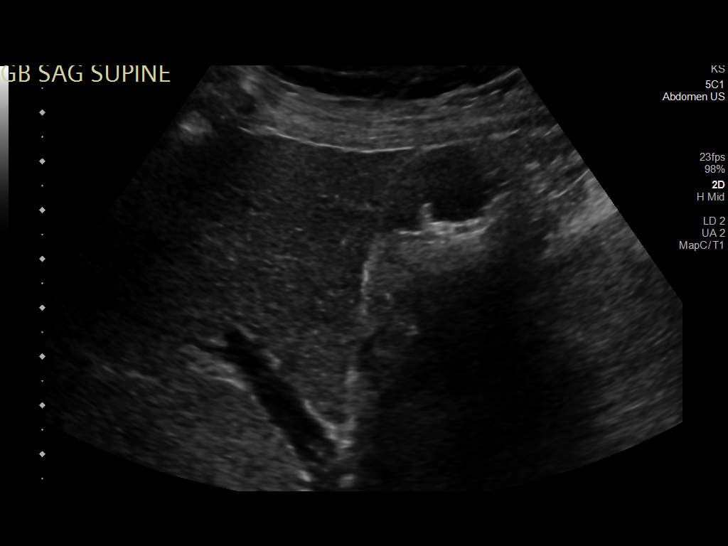

[14 of 25 positions shown; findings below may reference images not displayed]

FINDINGS: Gallbladder:

Sludge and multiple tiny layering stones identified within the
gallbladder. No pericholecystic fluid or sonographic Murphy's sign.
No gallbladder wall thickening.

Common bile duct:

Diameter: 1.8 mm.  No intrahepatic bile duct dilatation

Liver:

Increased parenchymal echogenicity. No focal lesion identified.
Portal vein is patent on color Doppler imaging with normal direction
of blood flow towards the liver.

Other: None.
IMPRESSION: 1. No acute findings.
2. Gallbladder sludge and stones.
